# Patient Record
Sex: Female | Born: 1950 | Race: White | Hispanic: No | Smoking: Former smoker
Health system: Southern US, Community
[De-identification: ages and names within clinical notes are randomized; demographics above are authoritative.]

## PROBLEM LIST (undated history)

## (undated) DIAGNOSIS — E785 Hyperlipidemia, unspecified: Secondary | ICD-10-CM

## (undated) DIAGNOSIS — I10 Essential (primary) hypertension: Secondary | ICD-10-CM

## (undated) HISTORY — PX: CHOLECYSTECTOMY: SHX55

---

## 2014-07-15 ENCOUNTER — Emergency Department: Payer: Self-pay | Admitting: Student

## 2014-08-23 DIAGNOSIS — I10 Essential (primary) hypertension: Secondary | ICD-10-CM | POA: Insufficient documentation

## 2014-09-14 ENCOUNTER — Other Ambulatory Visit: Payer: Self-pay

## 2014-09-14 ENCOUNTER — Encounter: Payer: Self-pay | Admitting: *Deleted

## 2014-09-14 ENCOUNTER — Emergency Department
Admission: EM | Admit: 2014-09-14 | Discharge: 2014-09-14 | Disposition: A | Discharge status: Home / Self Care | Payer: No Typology Code available for payment source | Attending: Emergency Medicine | Admitting: Emergency Medicine

## 2014-09-14 DIAGNOSIS — Z87891 Personal history of nicotine dependence: Secondary | ICD-10-CM | POA: Diagnosis not present

## 2014-09-14 DIAGNOSIS — I10 Essential (primary) hypertension: Secondary | ICD-10-CM | POA: Diagnosis not present

## 2014-09-14 DIAGNOSIS — R799 Abnormal finding of blood chemistry, unspecified: Secondary | ICD-10-CM | POA: Diagnosis present

## 2014-09-14 HISTORY — DX: Essential (primary) hypertension: I10

## 2014-09-14 LAB — CBC
HEMATOCRIT: 49.1 % — AB (ref 35.0–47.0)
Hemoglobin: 16.3 g/dL — ABNORMAL HIGH (ref 12.0–16.0)
MCH: 32.6 pg (ref 26.0–34.0)
MCHC: 33.2 g/dL (ref 32.0–36.0)
MCV: 98.1 fL (ref 80.0–100.0)
Platelets: 246 10*3/uL (ref 150–440)
RBC: 5.01 MIL/uL (ref 3.80–5.20)
RDW: 13.2 % (ref 11.5–14.5)
WBC: 7.4 10*3/uL (ref 3.6–11.0)

## 2014-09-14 LAB — BASIC METABOLIC PANEL
Anion gap: 9 (ref 5–15)
BUN: 16 mg/dL (ref 6–20)
CALCIUM: 10.5 mg/dL — AB (ref 8.9–10.3)
CHLORIDE: 97 mmol/L — AB (ref 101–111)
CO2: 29 mmol/L (ref 22–32)
CREATININE: 1.01 mg/dL — AB (ref 0.44–1.00)
GFR calc Af Amer: >60 mL/min (ref >60)
GFR calc non Af Amer: 58 mL/min — ABNORMAL LOW (ref >60)
Glucose, Bld: 80 mg/dL (ref 65–99)
POTASSIUM: 4.9 mmol/L (ref 3.5–5.1)
SODIUM: 135 mmol/L (ref 135–145)

## 2014-09-14 LAB — TROPONIN I: Troponin I: <0.03 ng/mL (ref <0.031)

## 2014-09-14 NOTE — ED Notes (Signed)
Pt saw her doctor Thayer Ohm at [facility]) today for evaluation of HBP. They called tonight to tell her to get further evaluation for high potassium (6.3). Pt denies further symptoms at this time.

## 2014-09-14 NOTE — ED Notes (Signed)
Patient with no complaints at this time. Respirations even and unlabored. Skin warm/dry. Discharge instructions reviewed with patient at this time. Patient given opportunity to voice concerns/ask questions. Patient discharged at this time and left Emergency Department with steady gait.   

## 2014-09-14 NOTE — ED Provider Notes (Signed)
[facility] Emergency Department Provider Note  ____________________________________________  Time seen: Approximately 10:02 PM  I have reviewed the triage vital signs and the nursing notes.   HISTORY  Chief Complaint Abnormal Lab    HPI Tammy Shelton is a 64 y.o. female who presents at the request of her primary care doctor for a high potassium of 6.3.  Her primary care doctor recently started her on HCTZ for her high blood pressure.  During her last blood work couple of weeks ago her potassium was normal at 5.0.  Her primary care doctor called her today to tell her that her most recent potassium is 6.3 and that she should go to the emergency department.  She denies chest pain, shortness of breath, nausea, vomiting, diarrhea.  She has had normal eating and drinking habits lately and has no history of kidney disease.   Past Medical History  Diagnosis Date  . Hypertension     There are no active problems to display for this patient.   Past Surgical History  Procedure Laterality Date  . Cholecystectomy      No current outpatient prescriptions on file.  Allergies Review of patient's allergies indicates no known allergies.  No family history on file.  Social History History  Substance Use Topics  . Smoking status: Former Research scientist (life sciences)  . Smokeless tobacco: Not on file  . Alcohol Use: Yes    Review of Systems Constitutional: No fever/chills Eyes: No visual changes. ENT: No sore throat. Cardiovascular: Denies chest pain. Respiratory: Denies shortness of breath. Gastrointestinal: No abdominal pain.  No nausea, no vomiting.  No diarrhea.  No constipation. Genitourinary: Negative for dysuria. Musculoskeletal: Negative for back pain. Skin: Negative for rash. Neurological: Negative for headaches, focal weakness or numbness.  10-point ROS otherwise negative.  ____________________________________________   PHYSICAL EXAM:  VITAL SIGNS: ED  Triage Vitals  Enc Vitals Group     BP 09/14/14 2052 155/101 mmHg     Pulse Rate 09/14/14 2052 83     Resp 09/14/14 2052 16     Temp 09/14/14 2052 98.3 F (36.8 C)     Temp Source 09/14/14 2052 Oral     SpO2 09/14/14 2052 100 %     Weight 09/14/14 2052 156 lb (70.761 kg)     Height 09/14/14 2052 5' (1.524 m)     Head Cir --      Peak Flow --      Pain Score --      Pain Loc --      Pain Edu? --      Excl. in [place]? --     Constitutional: Alert and oriented. Well appearing and in no acute distress. Eyes: Conjunctivae are normal. PERRL. EOMI. Head: Atraumatic. Nose: No congestion/rhinnorhea. Mouth/Throat: Mucous membranes are moist.  Oropharynx non-erythematous. Neck: No stridor.   Cardiovascular: Normal rate, regular rhythm. Grossly normal heart sounds.  Good peripheral circulation. Respiratory: Normal respiratory effort.  No retractions. Lungs CTAB. Gastrointestinal: Soft and nontender. No distention. No abdominal bruits. No CVA tenderness. Musculoskeletal: No lower extremity tenderness nor edema.  No joint effusions. Neurologic:  Normal speech and language. No gross focal neurologic deficits are appreciated. Speech is normal. No gait instability. Skin:  Skin is warm, dry and intact. No rash noted. Psychiatric: Mood and affect are normal. Speech and behavior are normal.  ____________________________________________   LABS  Results for orders placed or performed during the hospital encounter of 09/14/14  CBC  Result Value Ref Range  WBC 7.4 3.6 - 11.0 K/uL   RBC 5.01 3.80 - 5.20 MIL/uL   Hemoglobin 16.3 (H) 12.0 - 16.0 g/dL   HCT 49.1 (H) 35.0 - 47.0 %   MCV 98.1 80.0 - 100.0 fL   MCH 32.6 26.0 - 34.0 pg   MCHC 33.2 32.0 - 36.0 g/dL   RDW 13.2 11.5 - 14.5 %   Platelets 246 150 - 440 K/uL  Basic metabolic panel  Result Value Ref Range   Sodium 135 135 - 145 mmol/L   Potassium 4.9 3.5 - 5.1 mmol/L   Chloride 97 (L) 101 - 111 mmol/L   CO2 29 22 - 32 mmol/L   Glucose,  Bld 80 65 - 99 mg/dL   BUN 16 6 - 20 mg/dL   Creatinine, Ser 1.01 (H) 0.44 - 1.00 mg/dL   Calcium 10.5 (H) 8.9 - 10.3 mg/dL   GFR calc non Af Amer 58 (L) >60 mL/min   GFR calc Af Amer >60 >60 mL/min   Anion gap 9 5 - 15  Troponin I  Result Value Ref Range   Troponin I <0.03 <0.031 ng/mL     ____________________________________________  EKG   Date: 09/14/2014  Rate: 64  Rhythm: normal sinus rhythm  QRS Axis: normal  Intervals: normal  ST/T Wave abnormalities: normal.  T waves are normal amplitude (not tall)  Conduction Disutrbances: none  Narrative Interpretation: unremarkable     ____________________________________________  RADIOLOGY  No results found.  ____________________________________________   PROCEDURES  Procedure(s) performed: None  Critical Care performed: No  ____________________________________________   INITIAL IMPRESSION / ASSESSMENT AND PLAN / ED COURSE  Pertinent labs & imaging results that were available during my care of the patient were reviewed by me and considered in my medical decision making (see chart for details).  Repeat blood work here in the emergency department was normal.  Based on her history, lack of symptoms, reassuring EKG, normal labs, I am confident that the outpatient lab value was erroneously high.  I discussed with this with the patient and she understands and agrees with the plan for outpatient follow-up and repeat lab work as needed by her primary care physician.  I gave her my usual and customary return precautions. ____________________________________________   FINAL CLINICAL IMPRESSION(S) / ED DIAGNOSES  Final diagnoses:  Chronic hypertension     Hinda Kehr, MD 09/14/14 2231

## 2014-09-14 NOTE — ED Notes (Signed)
Pt was seen by PCP with blood work completed at that time. PCP called and advised pt to seek further medical treatment today for follow-up with increased K+ level of 6.3. Pt is calm, NAD noted and denies any pain at this time. Pt is alert and oriented x4.

## 2014-09-14 NOTE — Discharge Instructions (Signed)
Fortunately your repeat lab work here in the emergency department was normal and you have a normal EKG.  It is very likely that the outpatient lab value for potassium was erroneously high.  This happens sometimes in all labs and is nothing to be concerned about at this time.  We do recommend that he follow up with your primary care doctor for repeat testing as she feels as appropriate.  If he develop any new or concerning symptoms, please return to the emergency department.   Hypertension Hypertension, commonly called high blood pressure, is when the force of blood pumping through your arteries is too strong. Your arteries are the blood vessels that carry blood from your heart throughout your body. A blood pressure reading consists of a higher number over a lower number, such as 110/72. The higher number (systolic) is the pressure inside your arteries when your heart pumps. The lower number (diastolic) is the pressure inside your arteries when your heart relaxes. Ideally you want your blood pressure below 120/80. Hypertension forces your heart to work harder to pump blood. Your arteries may become narrow or stiff. Having hypertension puts you at risk for heart disease, stroke, and other problems.  RISK FACTORS Some risk factors for high blood pressure are controllable. Others are not.  Risk factors you cannot control include:   Race. You may be at higher risk if you are African American.  Age. Risk increases with age.  Gender. Men are at higher risk than women before age 38 years. After age 38, women are at higher risk than men. Risk factors you can control include:  Not getting enough exercise or physical activity.  Being overweight.  Getting too much fat, sugar, calories, or salt in your diet.  Drinking too much alcohol. SIGNS AND SYMPTOMS Hypertension does not usually cause signs or symptoms. Extremely high blood pressure (hypertensive crisis) may cause headache, anxiety, shortness of  breath, and nosebleed. DIAGNOSIS  To check if you have hypertension, your health care provider will measure your blood pressure while you are seated, with your arm held at the level of your heart. It should be measured at least twice using the same arm. Certain conditions can cause a difference in blood pressure between your right and left arms. A blood pressure reading that is higher than normal on one occasion does not mean that you need treatment. If one blood pressure reading is high, ask your health care provider about having it checked again. TREATMENT  Treating high blood pressure includes making lifestyle changes and possibly taking medicine. Living a healthy lifestyle can help lower high blood pressure. You may need to change some of your habits. Lifestyle changes may include:  Following the DASH diet. This diet is high in fruits, vegetables, and whole grains. It is low in salt, red meat, and added sugars.  Getting at least 2 hours of brisk physical activity every week.  Losing weight if necessary.  Not smoking.  Limiting alcoholic beverages.  Learning ways to reduce stress. If lifestyle changes are not enough to get your blood pressure under control, your health care provider may prescribe medicine. You may need to take more than one. Work closely with your health care provider to understand the risks and benefits. HOME CARE INSTRUCTIONS  Have your blood pressure rechecked as directed by your health care provider.   Take medicines only as directed by your health care provider. Follow the directions carefully. Blood pressure medicines must be taken as prescribed. The medicine does  not work as well when you skip doses. Skipping doses also puts you at risk for problems.   Do not smoke.   Monitor your blood pressure at home as directed by your health care provider. SEEK MEDICAL CARE IF:   You think you are having a reaction to medicines taken.  You have recurrent headaches  or feel dizzy.  You have swelling in your ankles.  You have trouble with your vision. SEEK IMMEDIATE MEDICAL CARE IF:  You develop a severe headache or confusion.  You have unusual weakness, numbness, or feel faint.  You have severe chest or abdominal pain.  You vomit repeatedly.  You have trouble breathing. MAKE SURE YOU:   Understand these instructions.  Will watch your condition.  Will get help right away if you are not doing well or get worse. Document Released: 04/21/2005 Document Revised: 09/05/2013 Document Reviewed: 02/11/2013 [facility] Patient Information 2015 [place], [place]. This information is not intended to replace advice given to you by your health care provider. Make sure you discuss any questions you have with your health care provider.

## 2015-05-29 DIAGNOSIS — Z87891 Personal history of nicotine dependence: Secondary | ICD-10-CM | POA: Insufficient documentation

## 2015-06-19 DIAGNOSIS — M2550 Pain in unspecified joint: Secondary | ICD-10-CM | POA: Insufficient documentation

## 2015-06-19 DIAGNOSIS — R768 Other specified abnormal immunological findings in serum: Secondary | ICD-10-CM | POA: Insufficient documentation

## 2015-06-19 DIAGNOSIS — N183 Chronic kidney disease, stage 3 unspecified: Secondary | ICD-10-CM | POA: Insufficient documentation

## 2015-11-29 DIAGNOSIS — E7800 Pure hypercholesterolemia, unspecified: Secondary | ICD-10-CM | POA: Insufficient documentation

## 2015-12-11 DIAGNOSIS — E875 Hyperkalemia: Secondary | ICD-10-CM | POA: Diagnosis not present

## 2015-12-11 DIAGNOSIS — E559 Vitamin D deficiency, unspecified: Secondary | ICD-10-CM | POA: Diagnosis not present

## 2015-12-11 DIAGNOSIS — N182 Chronic kidney disease, stage 2 (mild): Secondary | ICD-10-CM | POA: Diagnosis not present

## 2015-12-11 DIAGNOSIS — I10 Essential (primary) hypertension: Secondary | ICD-10-CM | POA: Diagnosis not present

## 2016-05-07 DIAGNOSIS — H6122 Impacted cerumen, left ear: Secondary | ICD-10-CM | POA: Diagnosis not present

## 2016-06-03 DIAGNOSIS — N183 Chronic kidney disease, stage 3 (moderate): Secondary | ICD-10-CM | POA: Diagnosis not present

## 2016-06-03 DIAGNOSIS — E7800 Pure hypercholesterolemia, unspecified: Secondary | ICD-10-CM | POA: Diagnosis not present

## 2016-06-03 DIAGNOSIS — I10 Essential (primary) hypertension: Secondary | ICD-10-CM | POA: Diagnosis not present

## 2017-01-07 ENCOUNTER — Other Ambulatory Visit: Payer: Self-pay | Admitting: Family Medicine

## 2017-01-07 DIAGNOSIS — E559 Vitamin D deficiency, unspecified: Secondary | ICD-10-CM | POA: Diagnosis not present

## 2017-01-07 DIAGNOSIS — Z1231 Encounter for screening mammogram for malignant neoplasm of breast: Secondary | ICD-10-CM | POA: Diagnosis not present

## 2017-01-07 DIAGNOSIS — E871 Hypo-osmolality and hyponatremia: Secondary | ICD-10-CM | POA: Diagnosis not present

## 2017-01-07 DIAGNOSIS — N183 Chronic kidney disease, stage 3 (moderate): Secondary | ICD-10-CM | POA: Diagnosis not present

## 2017-01-07 DIAGNOSIS — Z79899 Other long term (current) drug therapy: Secondary | ICD-10-CM | POA: Diagnosis not present

## 2017-01-07 DIAGNOSIS — E785 Hyperlipidemia, unspecified: Secondary | ICD-10-CM | POA: Diagnosis not present

## 2017-01-07 DIAGNOSIS — I10 Essential (primary) hypertension: Secondary | ICD-10-CM | POA: Diagnosis not present

## 2017-01-07 DIAGNOSIS — Z1159 Encounter for screening for other viral diseases: Secondary | ICD-10-CM | POA: Diagnosis not present

## 2017-01-12 DIAGNOSIS — E871 Hypo-osmolality and hyponatremia: Secondary | ICD-10-CM | POA: Insufficient documentation

## 2017-01-23 ENCOUNTER — Ambulatory Visit
Admission: RE | Admit: 2017-01-23 | Discharge: 2017-01-23 | Disposition: A | Discharge status: Home / Self Care | Payer: Medicare HMO | Source: Ambulatory Visit | Attending: Family Medicine | Admitting: Family Medicine

## 2017-01-23 DIAGNOSIS — Z1231 Encounter for screening mammogram for malignant neoplasm of breast: Secondary | ICD-10-CM | POA: Diagnosis not present

## 2017-02-13 DIAGNOSIS — Z8639 Personal history of other endocrine, nutritional and metabolic disease: Secondary | ICD-10-CM | POA: Insufficient documentation

## 2017-02-13 DIAGNOSIS — I10 Essential (primary) hypertension: Secondary | ICD-10-CM | POA: Diagnosis not present

## 2017-02-13 DIAGNOSIS — N183 Chronic kidney disease, stage 3 (moderate): Secondary | ICD-10-CM | POA: Diagnosis not present

## 2017-10-23 DIAGNOSIS — Z1231 Encounter for screening mammogram for malignant neoplasm of breast: Secondary | ICD-10-CM | POA: Diagnosis not present

## 2017-10-23 DIAGNOSIS — E7800 Pure hypercholesterolemia, unspecified: Secondary | ICD-10-CM | POA: Diagnosis not present

## 2017-10-23 DIAGNOSIS — R918 Other nonspecific abnormal finding of lung field: Secondary | ICD-10-CM | POA: Diagnosis not present

## 2017-10-23 DIAGNOSIS — I10 Essential (primary) hypertension: Secondary | ICD-10-CM | POA: Diagnosis not present

## 2017-10-23 DIAGNOSIS — Z0000 Encounter for general adult medical examination without abnormal findings: Secondary | ICD-10-CM | POA: Diagnosis not present

## 2017-10-23 DIAGNOSIS — Z8639 Personal history of other endocrine, nutritional and metabolic disease: Secondary | ICD-10-CM | POA: Diagnosis not present

## 2017-10-23 DIAGNOSIS — N183 Chronic kidney disease, stage 3 (moderate): Secondary | ICD-10-CM | POA: Diagnosis not present

## 2017-10-23 DIAGNOSIS — Z79899 Other long term (current) drug therapy: Secondary | ICD-10-CM | POA: Diagnosis not present

## 2017-10-26 ENCOUNTER — Other Ambulatory Visit: Payer: Self-pay | Admitting: Internal Medicine

## 2017-10-26 DIAGNOSIS — R911 Solitary pulmonary nodule: Secondary | ICD-10-CM

## 2017-10-30 ENCOUNTER — Ambulatory Visit
Admission: RE | Admit: 2017-10-30 | Discharge: 2017-10-30 | Disposition: A | Discharge status: Home / Self Care | Payer: Medicare HMO | Source: Ambulatory Visit | Attending: Internal Medicine | Admitting: Internal Medicine

## 2017-10-30 DIAGNOSIS — J439 Emphysema, unspecified: Secondary | ICD-10-CM | POA: Diagnosis not present

## 2017-10-30 DIAGNOSIS — R911 Solitary pulmonary nodule: Secondary | ICD-10-CM | POA: Diagnosis not present

## 2017-10-30 DIAGNOSIS — I700 Atherosclerosis of aorta: Secondary | ICD-10-CM | POA: Insufficient documentation

## 2017-10-30 DIAGNOSIS — R918 Other nonspecific abnormal finding of lung field: Secondary | ICD-10-CM | POA: Diagnosis not present

## 2017-10-30 DIAGNOSIS — I2510 Atherosclerotic heart disease of native coronary artery without angina pectoris: Secondary | ICD-10-CM | POA: Insufficient documentation

## 2017-10-30 MED ORDER — IOPAMIDOL (ISOVUE-300) INJECTION 61%
65.0000 mL | Freq: Once | INTRAVENOUS | Status: AC | PRN
  Administered 2017-10-30: 65 mL via INTRAVENOUS

## 2017-11-13 ENCOUNTER — Other Ambulatory Visit: Payer: Self-pay | Admitting: Internal Medicine

## 2017-11-13 DIAGNOSIS — R942 Abnormal results of pulmonary function studies: Secondary | ICD-10-CM

## 2018-01-29 DIAGNOSIS — H1850 Unspecified hereditary corneal dystrophies: Secondary | ICD-10-CM | POA: Diagnosis not present

## 2018-02-19 ENCOUNTER — Ambulatory Visit: Payer: Medicare HMO

## 2018-04-23 DIAGNOSIS — Z8639 Personal history of other endocrine, nutritional and metabolic disease: Secondary | ICD-10-CM | POA: Diagnosis not present

## 2018-04-23 DIAGNOSIS — R918 Other nonspecific abnormal finding of lung field: Secondary | ICD-10-CM | POA: Insufficient documentation

## 2018-04-23 DIAGNOSIS — Z79899 Other long term (current) drug therapy: Secondary | ICD-10-CM | POA: Diagnosis not present

## 2018-04-23 DIAGNOSIS — I10 Essential (primary) hypertension: Secondary | ICD-10-CM | POA: Diagnosis not present

## 2018-04-23 DIAGNOSIS — E7800 Pure hypercholesterolemia, unspecified: Secondary | ICD-10-CM | POA: Diagnosis not present

## 2018-04-23 DIAGNOSIS — E871 Hypo-osmolality and hyponatremia: Secondary | ICD-10-CM | POA: Diagnosis not present

## 2018-04-23 DIAGNOSIS — N183 Chronic kidney disease, stage 3 (moderate): Secondary | ICD-10-CM | POA: Diagnosis not present

## 2018-10-20 DIAGNOSIS — E785 Hyperlipidemia, unspecified: Secondary | ICD-10-CM | POA: Diagnosis not present

## 2018-10-20 DIAGNOSIS — I10 Essential (primary) hypertension: Secondary | ICD-10-CM | POA: Diagnosis not present

## 2018-10-20 DIAGNOSIS — Z87891 Personal history of nicotine dependence: Secondary | ICD-10-CM | POA: Diagnosis not present

## 2018-10-25 ENCOUNTER — Other Ambulatory Visit: Payer: Self-pay | Admitting: Internal Medicine

## 2018-10-25 DIAGNOSIS — Z1231 Encounter for screening mammogram for malignant neoplasm of breast: Secondary | ICD-10-CM

## 2018-10-29 ENCOUNTER — Other Ambulatory Visit: Payer: Self-pay

## 2018-10-29 ENCOUNTER — Ambulatory Visit
Admission: RE | Admit: 2018-10-29 | Discharge: 2018-10-29 | Disposition: A | Discharge status: Home / Self Care | Payer: Medicare HMO | Source: Ambulatory Visit | Attending: Internal Medicine | Admitting: Internal Medicine

## 2018-10-29 DIAGNOSIS — Z1231 Encounter for screening mammogram for malignant neoplasm of breast: Secondary | ICD-10-CM | POA: Diagnosis not present

## 2018-11-12 DIAGNOSIS — Z0000 Encounter for general adult medical examination without abnormal findings: Secondary | ICD-10-CM | POA: Diagnosis not present

## 2018-11-12 DIAGNOSIS — E7800 Pure hypercholesterolemia, unspecified: Secondary | ICD-10-CM | POA: Diagnosis not present

## 2018-11-12 DIAGNOSIS — Z79899 Other long term (current) drug therapy: Secondary | ICD-10-CM | POA: Diagnosis not present

## 2018-11-12 DIAGNOSIS — N183 Chronic kidney disease, stage 3 (moderate): Secondary | ICD-10-CM | POA: Diagnosis not present

## 2018-11-12 DIAGNOSIS — J449 Chronic obstructive pulmonary disease, unspecified: Secondary | ICD-10-CM | POA: Diagnosis not present

## 2018-11-12 DIAGNOSIS — J431 Panlobular emphysema: Secondary | ICD-10-CM | POA: Diagnosis not present

## 2018-11-12 DIAGNOSIS — Z1211 Encounter for screening for malignant neoplasm of colon: Secondary | ICD-10-CM | POA: Diagnosis not present

## 2018-11-12 DIAGNOSIS — Z8639 Personal history of other endocrine, nutritional and metabolic disease: Secondary | ICD-10-CM | POA: Diagnosis not present

## 2018-11-12 DIAGNOSIS — I10 Essential (primary) hypertension: Secondary | ICD-10-CM | POA: Diagnosis not present

## 2018-11-12 DIAGNOSIS — R918 Other nonspecific abnormal finding of lung field: Secondary | ICD-10-CM | POA: Diagnosis not present

## 2018-11-12 DIAGNOSIS — R399 Unspecified symptoms and signs involving the genitourinary system: Secondary | ICD-10-CM | POA: Diagnosis not present

## 2018-11-19 DIAGNOSIS — R918 Other nonspecific abnormal finding of lung field: Secondary | ICD-10-CM | POA: Diagnosis not present

## 2018-11-22 DIAGNOSIS — Z1211 Encounter for screening for malignant neoplasm of colon: Secondary | ICD-10-CM | POA: Diagnosis not present

## 2019-05-20 DIAGNOSIS — R918 Other nonspecific abnormal finding of lung field: Secondary | ICD-10-CM | POA: Diagnosis not present

## 2019-05-20 DIAGNOSIS — I129 Hypertensive chronic kidney disease with stage 1 through stage 4 chronic kidney disease, or unspecified chronic kidney disease: Secondary | ICD-10-CM | POA: Diagnosis not present

## 2019-05-20 DIAGNOSIS — E785 Hyperlipidemia, unspecified: Secondary | ICD-10-CM | POA: Diagnosis not present

## 2019-05-20 DIAGNOSIS — Z0000 Encounter for general adult medical examination without abnormal findings: Secondary | ICD-10-CM | POA: Diagnosis not present

## 2019-05-20 DIAGNOSIS — Z87891 Personal history of nicotine dependence: Secondary | ICD-10-CM | POA: Diagnosis not present

## 2019-05-20 DIAGNOSIS — N189 Chronic kidney disease, unspecified: Secondary | ICD-10-CM | POA: Diagnosis not present

## 2019-06-03 DIAGNOSIS — Z87891 Personal history of nicotine dependence: Secondary | ICD-10-CM | POA: Diagnosis not present

## 2019-06-03 DIAGNOSIS — E7800 Pure hypercholesterolemia, unspecified: Secondary | ICD-10-CM | POA: Diagnosis not present

## 2019-06-03 DIAGNOSIS — R918 Other nonspecific abnormal finding of lung field: Secondary | ICD-10-CM | POA: Diagnosis not present

## 2019-06-03 DIAGNOSIS — I10 Essential (primary) hypertension: Secondary | ICD-10-CM | POA: Diagnosis not present

## 2019-06-03 DIAGNOSIS — Z79899 Other long term (current) drug therapy: Secondary | ICD-10-CM | POA: Diagnosis not present

## 2019-06-03 DIAGNOSIS — J90 Pleural effusion, not elsewhere classified: Secondary | ICD-10-CM | POA: Diagnosis not present

## 2019-06-06 ENCOUNTER — Other Ambulatory Visit: Payer: Self-pay | Admitting: Internal Medicine

## 2019-06-06 DIAGNOSIS — J90 Pleural effusion, not elsewhere classified: Secondary | ICD-10-CM

## 2019-06-09 ENCOUNTER — Ambulatory Visit: Payer: Medicare HMO

## 2019-06-10 ENCOUNTER — Other Ambulatory Visit: Payer: Self-pay

## 2019-06-10 ENCOUNTER — Ambulatory Visit
Admission: RE | Admit: 2019-06-10 | Discharge: 2019-06-10 | Disposition: A | Discharge status: Home / Self Care | Payer: Medicare HMO | Source: Ambulatory Visit | Attending: Internal Medicine | Admitting: Internal Medicine

## 2019-06-10 ENCOUNTER — Encounter (INDEPENDENT_AMBULATORY_CARE_PROVIDER_SITE_OTHER): Payer: Self-pay

## 2019-06-10 DIAGNOSIS — J90 Pleural effusion, not elsewhere classified: Secondary | ICD-10-CM | POA: Diagnosis not present

## 2019-06-10 DIAGNOSIS — R918 Other nonspecific abnormal finding of lung field: Secondary | ICD-10-CM | POA: Diagnosis not present

## 2019-06-10 MED ORDER — IOHEXOL 300 MG/ML  SOLN
75.0000 mL | Freq: Once | INTRAMUSCULAR | Status: AC | PRN
  Administered 2019-06-10: 75 mL via INTRAVENOUS

## 2019-06-14 ENCOUNTER — Other Ambulatory Visit: Payer: Self-pay

## 2019-06-14 ENCOUNTER — Other Ambulatory Visit: Payer: Self-pay | Admitting: Pulmonary Disease

## 2019-06-14 ENCOUNTER — Other Ambulatory Visit
Admission: RE | Admit: 2019-06-14 | Discharge: 2019-06-14 | Disposition: A | Discharge status: Home / Self Care | Payer: Medicare HMO | Source: Ambulatory Visit | Attending: Pulmonary Disease | Admitting: Pulmonary Disease

## 2019-06-14 DIAGNOSIS — Z01812 Encounter for preprocedural laboratory examination: Secondary | ICD-10-CM | POA: Diagnosis not present

## 2019-06-14 DIAGNOSIS — Z20822 Contact with and (suspected) exposure to covid-19: Secondary | ICD-10-CM | POA: Diagnosis not present

## 2019-06-14 DIAGNOSIS — J90 Pleural effusion, not elsewhere classified: Secondary | ICD-10-CM

## 2019-06-14 LAB — SARS CORONAVIRUS 2 (TAT 6-24 HRS): SARS Coronavirus 2: NEGATIVE

## 2019-06-15 ENCOUNTER — Ambulatory Visit
Admission: RE | Admit: 2019-06-15 | Discharge: 2019-06-15 | Disposition: A | Discharge status: Home / Self Care | Payer: Medicare HMO | Source: Ambulatory Visit | Attending: Interventional Radiology | Admitting: Interventional Radiology

## 2019-06-15 ENCOUNTER — Ambulatory Visit
Admission: RE | Admit: 2019-06-15 | Discharge: 2019-06-15 | Disposition: A | Discharge status: Home / Self Care | Payer: Medicare HMO | Source: Ambulatory Visit | Attending: Pulmonary Disease | Admitting: Pulmonary Disease

## 2019-06-15 DIAGNOSIS — J90 Pleural effusion, not elsewhere classified: Secondary | ICD-10-CM | POA: Diagnosis not present

## 2019-06-15 DIAGNOSIS — Z9889 Other specified postprocedural states: Secondary | ICD-10-CM

## 2019-06-15 LAB — BODY FLUID CELL COUNT WITH DIFFERENTIAL
Eos, Fluid: 0 %
Lymphs, Fluid: 91 %
Monocyte-Macrophage-Serous Fluid: 3 %
Neutrophil Count, Fluid: 6 %
Total Nucleated Cell Count, Fluid: 859 cu mm

## 2019-06-15 LAB — LACTATE DEHYDROGENASE, PLEURAL OR PERITONEAL FLUID: LD, Fluid: 149 U/L — ABNORMAL HIGH (ref 3–23)

## 2019-06-15 LAB — PROTEIN, PLEURAL OR PERITONEAL FLUID: Total protein, fluid: 5 g/dL

## 2019-06-15 LAB — GLUCOSE, PLEURAL OR PERITONEAL FLUID: Glucose, Fluid: 89 mg/dL

## 2019-06-15 NOTE — Procedures (Signed)
Pre procedural Dx: Symptomatic pleural effusion Post procedural Dx: Same  Successful US guided right sided thoracentesis yielding 900 cc of serous pleural fluid.   Samples sent to lab for analysis.  EBL: None Complications: None immediate.  Ronny Bacon, MD Pager #: [phone]

## 2019-06-17 LAB — PH, BODY FLUID: pH, Body Fluid: 7.7

## 2019-06-17 LAB — ACID FAST SMEAR (AFB, MYCOBACTERIA): Acid Fast Smear: NEGATIVE

## 2019-06-17 LAB — CYTOLOGY - NON PAP

## 2019-06-19 LAB — BODY FLUID CULTURE
Culture: NO GROWTH
Gram Stain: NONE SEEN

## 2019-07-08 DIAGNOSIS — I10 Essential (primary) hypertension: Secondary | ICD-10-CM | POA: Diagnosis not present

## 2019-07-08 DIAGNOSIS — D649 Anemia, unspecified: Secondary | ICD-10-CM | POA: Diagnosis not present

## 2019-07-08 DIAGNOSIS — Z1211 Encounter for screening for malignant neoplasm of colon: Secondary | ICD-10-CM | POA: Diagnosis not present

## 2019-07-15 DIAGNOSIS — Z1211 Encounter for screening for malignant neoplasm of colon: Secondary | ICD-10-CM | POA: Diagnosis not present

## 2019-07-15 DIAGNOSIS — D649 Anemia, unspecified: Secondary | ICD-10-CM | POA: Diagnosis not present

## 2019-07-15 DIAGNOSIS — I10 Essential (primary) hypertension: Secondary | ICD-10-CM | POA: Diagnosis not present

## 2019-07-15 LAB — FUNGUS CULTURE WITH STAIN

## 2019-07-15 LAB — FUNGUS CULTURE RESULT

## 2019-07-15 LAB — FUNGAL ORGANISM REFLEX

## 2019-07-22 DIAGNOSIS — D649 Anemia, unspecified: Secondary | ICD-10-CM | POA: Diagnosis not present

## 2019-07-22 DIAGNOSIS — E538 Deficiency of other specified B group vitamins: Secondary | ICD-10-CM | POA: Diagnosis not present

## 2019-07-28 ENCOUNTER — Other Ambulatory Visit: Payer: Self-pay

## 2019-07-28 ENCOUNTER — Encounter: Payer: Self-pay | Admitting: Oncology

## 2019-07-28 NOTE — Progress Notes (Signed)
Pt called to go over information related to first visit at [facility]. Questions and concerns gone over.

## 2019-07-29 ENCOUNTER — Encounter (INDEPENDENT_AMBULATORY_CARE_PROVIDER_SITE_OTHER): Payer: Self-pay

## 2019-07-29 ENCOUNTER — Inpatient Hospital Stay: Payer: Medicare HMO

## 2019-07-29 ENCOUNTER — Inpatient Hospital Stay: Payer: Medicare HMO | Attending: Oncology | Admitting: Oncology

## 2019-07-29 VITALS — BP 136/57 | HR 66 | Temp 96.2°F | Resp 20 | Wt 146.3 lb

## 2019-07-29 DIAGNOSIS — Z87891 Personal history of nicotine dependence: Secondary | ICD-10-CM | POA: Diagnosis not present

## 2019-07-29 DIAGNOSIS — I10 Essential (primary) hypertension: Secondary | ICD-10-CM | POA: Diagnosis not present

## 2019-07-29 DIAGNOSIS — D649 Anemia, unspecified: Secondary | ICD-10-CM | POA: Diagnosis not present

## 2019-07-29 DIAGNOSIS — Z79899 Other long term (current) drug therapy: Secondary | ICD-10-CM | POA: Insufficient documentation

## 2019-07-29 DIAGNOSIS — R700 Elevated erythrocyte sedimentation rate: Secondary | ICD-10-CM | POA: Diagnosis not present

## 2019-07-29 DIAGNOSIS — E538 Deficiency of other specified B group vitamins: Secondary | ICD-10-CM | POA: Diagnosis not present

## 2019-07-29 LAB — SEDIMENTATION RATE: Sed Rate: 119 mm/hr — ABNORMAL HIGH (ref 0–30)

## 2019-07-29 LAB — CBC
HCT: 31.9 % — ABNORMAL LOW (ref 36.0–46.0)
Hemoglobin: 10.1 g/dL — ABNORMAL LOW (ref 12.0–15.0)
MCH: 28.9 pg (ref 26.0–34.0)
MCHC: 31.7 g/dL (ref 30.0–36.0)
MCV: 91.4 fL (ref 80.0–100.0)
Platelets: 374 10*3/uL (ref 150–400)
RBC: 3.49 MIL/uL — ABNORMAL LOW (ref 3.87–5.11)
RDW: 14.2 % (ref 11.5–15.5)
WBC: 6.1 10*3/uL (ref 4.0–10.5)
nRBC: 0 % (ref 0.0–0.2)

## 2019-07-29 LAB — DAT, POLYSPECIFIC AHG (ARMC ONLY): Polyspecific AHG test: NEGATIVE

## 2019-07-29 LAB — FOLATE: Folate: 10.4 ng/mL

## 2019-07-29 LAB — RETICULOCYTES
Immature Retic Fract: 11.9 % (ref 2.3–15.9)
RBC.: 3.51 MIL/uL — ABNORMAL LOW (ref 3.87–5.11)
Retic Count, Absolute: 55.8 K/uL (ref 19.0–186.0)
Retic Ct Pct: 1.6 % (ref 0.4–3.1)

## 2019-07-29 LAB — ACID FAST CULTURE WITH REFLEXED SENSITIVITIES (MYCOBACTERIA): Acid Fast Culture: NEGATIVE

## 2019-07-29 LAB — LACTATE DEHYDROGENASE: LDH: 116 U/L (ref 98–192)

## 2019-07-29 NOTE — Progress Notes (Signed)
[place]  Telephone:(336) [phone] Fax:(336) [phone]  ID: Caffie Pinto OB: Jan 29, 1951  MR#: [id number]  TDH#:[id number]  Patient Care Team: Idelle Crouch, MD as PCP - General (Internal Medicine)  CHIEF COMPLAINT: Anemia, unspecified.  INTERVAL HISTORY: Patient is a 69 year old female who was noted to have a declining hemoglobin on routine blood work.  She currently feels well and is asymptomatic.  She does not complain of any weakness or fatigue.  She has good appetite and denies weight loss.  She has no neurologic complaints.  She denies any recent fevers or illnesses.  She has no chest pain, shortness of breath, cough, or hemoptysis.  She denies any nausea, vomiting, constipation, or diarrhea.  She has no melena or hematochezia.  She has no urinary complaints.  Patient feels at her baseline offers no specific complaints today.  REVIEW OF SYSTEMS:   Review of Systems  Constitutional: Negative.  Negative for fever, malaise/fatigue and weight loss.  Respiratory: Negative.  Negative for cough, hemoptysis and shortness of breath.   Cardiovascular: Negative.  Negative for chest pain and leg swelling.  Gastrointestinal: Negative.  Negative for abdominal pain, blood in stool and melena.  Genitourinary: Negative.  Negative for hematuria.  Musculoskeletal: Negative.  Negative for back pain.  Skin: Negative.  Negative for rash.  Neurological: Negative.  Negative for dizziness, focal weakness, weakness and headaches.  Psychiatric/Behavioral: Negative.  The patient is not nervous/anxious.     As per HPI. Otherwise, a complete review of systems is negative.  PAST MEDICAL HISTORY: Past Medical History:  Diagnosis Date  . Hypertension     PAST SURGICAL HISTORY: Past Surgical History:  Procedure Laterality Date  . CHOLECYSTECTOMY      FAMILY HISTORY: Family History  Problem Relation Age of Onset  . Breast cancer Neg Hx     ADVANCED DIRECTIVES (Y/N):  N   HEALTH MAINTENANCE: Social History   Tobacco Use  . Smoking status: Former Smoker  Substance Use Topics  . Alcohol use: Yes  . Drug use: No     Colonoscopy:  PAP:  Bone density:  Lipid panel:  No Known Allergies  Current Outpatient Medications  Medication Sig Dispense Refill  . amLODipine (NORVASC) 10 MG tablet Take 1 tablet by mouth daily.    . hydrochlorothiazide (HYDRODIURIL) 12.5 MG tablet Take 1 tablet by mouth daily.    Marland Kitchen losartan (COZAAR) 50 MG tablet Take 1 tablet by mouth daily.    Marland Kitchen lovastatin (MEVACOR) 20 MG tablet Take 1 tablet by mouth daily with supper.    . umeclidinium-vilanterol (ANORO ELLIPTA) 62.5-25 MCG/INH AEPB Inhale 1 puff into the lungs daily.     No current facility-administered medications for this visit.    OBJECTIVE: Vitals:   07/29/19 0939  BP: (!) 136/57  Pulse: 66  Resp: 20  Temp: (!) 96.2 F (35.7 C)  SpO2: 99%     Body mass index is 28.57 kg/m.    ECOG FS:0 - Asymptomatic  General: Well-developed, well-nourished, no acute distress. Eyes: Pink conjunctiva, anicteric sclera. HEENT: Normocephalic, moist mucous membranes. Lungs: No audible wheezing or coughing. Heart: Regular rate and rhythm. Abdomen: Soft, nontender, no obvious distention. Musculoskeletal: No edema, cyanosis, or clubbing. Neuro: Alert, answering all questions appropriately. Cranial nerves grossly intact. Skin: No rashes or petechiae noted. Psych: Normal affect. Lymphatics: No cervical, calvicular, axillary or inguinal LAD.   LAB RESULTS:  Lab Results  Component Value Date   NA 135 09/14/2014   K 4.9 09/14/2014  CL 97 (L) 09/14/2014   CO2 29 09/14/2014   GLUCOSE 80 09/14/2014   BUN 16 09/14/2014   CREATININE 1.01 (H) 09/14/2014   CALCIUM 10.5 (H) 09/14/2014   GFRNONAA 58 (L) 09/14/2014   GFRAA >60 09/14/2014    Lab Results  Component Value Date   WBC 6.1 07/29/2019   HGB 10.1 (L) 07/29/2019   HCT 31.9 (L) 07/29/2019   MCV 91.4 07/29/2019   PLT  374 07/29/2019     STUDIES: No results found.  ASSESSMENT: Anemia, unspecified.  PLAN:   1.  Anemia unspecified: Patient's hemoglobin remains mildly decreased to 10.1.  Previously, iron stores within normal limits, but patient was noted to have a decreased B12 level.  Reticulocyte count is inappropriately normal for this level of anemia.  Folate levels are within normal limits and there is no evidence of hemolysis.  SPEP was ordered for completeness and is pending at time of dictation.  Patient was noted to have a significantly elevated sedimentation rate possibly may be contributing to some mild bone marrow suppression.  Bone marrow biopsy is not necessary at this time, but would consider one in the future if her hemoglobin continues to drop or she develops other cytopenias.  Return to clinic in 1 week for further evaluation, discussion of her laboratory work, and initiation of B12 injections. 2.  B12 deficiency: Initiate B12 as above. 3.  History of pleural effusion: Patient is status post thoracentesis on June 15, 2019 with no evidence of malignancy.  Continue follow-up and treatment per pulmonary.  Patient expressed understanding and was in agreement with this plan. She also understands that She can call clinic at any time with any questions, concerns, or complaints.    Lloyd Huger, MD   07/29/2019 12:41 PM

## 2019-07-30 LAB — HAPTOGLOBIN: Haptoglobin: 362 mg/dL — ABNORMAL HIGH (ref 37–355)

## 2019-07-30 LAB — ERYTHROPOIETIN: Erythropoietin: 11.2 m[IU]/mL (ref 2.6–18.5)

## 2019-07-30 NOTE — Progress Notes (Signed)
[place]  Telephone:(336) [phone] Fax:(336) [phone]  ID: Tammy Shelton OB: Oct 02, 1950  MR#: [id number]  WNI#:[id number]  Patient Care Team: Idelle Crouch, MD as PCP - General (Internal Medicine)  CHIEF COMPLAINT: Anemia, unspecified.  INTERVAL HISTORY: Patient returns to clinic today for further evaluation, discussion of her laboratory results, and initiation of B12 injections.  She continues to feel well and remains asymptomatic. She does not complain of any weakness or fatigue.  She has good appetite and denies weight loss.  She has no neurologic complaints.  She denies any recent fevers or illnesses.  She has no chest pain, shortness of breath, cough, or hemoptysis.  She denies any nausea, vomiting, constipation, or diarrhea.  She has no melena or hematochezia.  She has no urinary complaints.  Patient offers no specific complaints today.  REVIEW OF SYSTEMS:   Review of Systems  Constitutional: Negative.  Negative for fever, malaise/fatigue and weight loss.  Respiratory: Negative.  Negative for cough, hemoptysis and shortness of breath.   Cardiovascular: Negative.  Negative for chest pain and leg swelling.  Gastrointestinal: Negative.  Negative for abdominal pain, blood in stool and melena.  Genitourinary: Negative.  Negative for hematuria.  Musculoskeletal: Negative.  Negative for back pain.  Skin: Negative.  Negative for rash.  Neurological: Negative.  Negative for dizziness, focal weakness, weakness and headaches.  Psychiatric/Behavioral: Negative.  The patient is not nervous/anxious.     As per HPI. Otherwise, a complete review of systems is negative.  PAST MEDICAL HISTORY: Past Medical History:  Diagnosis Date  . Hypertension     PAST SURGICAL HISTORY: Past Surgical History:  Procedure Laterality Date  . CHOLECYSTECTOMY      FAMILY HISTORY: Family History  Problem Relation Age of Onset  . Breast cancer Neg Hx     ADVANCED DIRECTIVES  (Y/N):  N  HEALTH MAINTENANCE: Social History   Tobacco Use  . Smoking status: Former Research scientist (life sciences)  . Smokeless tobacco: Never Used  Substance Use Topics  . Alcohol use: Yes  . Drug use: No     Colonoscopy:  PAP:  Bone density:  Lipid panel:  No Known Allergies  Current Outpatient Medications  Medication Sig Dispense Refill  . amLODipine (NORVASC) 10 MG tablet Take 1 tablet by mouth daily.    . hydrochlorothiazide (HYDRODIURIL) 12.5 MG tablet Take 1 tablet by mouth daily.    Marland Kitchen losartan (COZAAR) 50 MG tablet Take 1 tablet by mouth daily.    Marland Kitchen lovastatin (MEVACOR) 20 MG tablet Take 1 tablet by mouth daily with supper.    . umeclidinium-vilanterol (ANORO ELLIPTA) 62.5-25 MCG/INH AEPB Inhale 1 puff into the lungs daily.     No current facility-administered medications for this visit.    OBJECTIVE: Vitals:   08/05/19 1026  BP: 117/61  Pulse: (!) 56  Resp: 18  Temp: (!) 96 F (35.6 C)  SpO2: 100%     Body mass index is 28.83 kg/m.    ECOG FS:0 - Asymptomatic  General: Well-developed, well-nourished, no acute distress. Eyes: Pink conjunctiva, anicteric sclera. HEENT: Normocephalic, moist mucous membranes. Lungs: No audible wheezing or coughing. Heart: Regular rate and rhythm. Abdomen: Soft, nontender, no obvious distention. Musculoskeletal: No edema, cyanosis, or clubbing. Neuro: Alert, answering all questions appropriately. Cranial nerves grossly intact. Skin: No rashes or petechiae noted. Psych: Normal affect.  LAB RESULTS:  Lab Results  Component Value Date   NA 135 09/14/2014   K 4.9 09/14/2014   CL 97 (L) 09/14/2014  CO2 29 09/14/2014   GLUCOSE 80 09/14/2014   BUN 16 09/14/2014   CREATININE 1.01 (H) 09/14/2014   CALCIUM 10.5 (H) 09/14/2014   GFRNONAA 58 (L) 09/14/2014   GFRAA >60 09/14/2014    Lab Results  Component Value Date   WBC 6.1 07/29/2019   HGB 10.1 (L) 07/29/2019   HCT 31.9 (L) 07/29/2019   MCV 91.4 07/29/2019   PLT 374 07/29/2019      STUDIES: No results found.  ASSESSMENT: Anemia, unspecified.  PLAN:   1.  Anemia unspecified: Patient's hemoglobin remains mildly decreased to 10.1.  Previously, iron stores within normal limits, but patient was noted to have a decreased B12 level.  Reticulocyte count is inappropriately normal for this level of anemia.  All of her other laboratory work is also either negative or within normal limits.  She was noted to have a significantly elevated sedimentation rate possibly may be contributing to some mild bone marrow suppression.  Bone marrow biopsy is not necessary at this time, but would consider one in the future if her hemoglobin continues to drop or she develops other cytopenias.  Proceed with B12 injection today.  Return to clinic monthly for B12 injections and then in 4 months for repeat laboratory work, further evaluation, and continuation of treatment if needed.   2.  B12 deficiency: Monthly B12 as above. 3.  History of pleural effusion: Patient is status post thoracentesis on June 15, 2019 with no evidence of malignancy.  Continue follow-up and treatment per pulmonary. 4.  Elevated sed rate: Unclear etiology, but trending down.  Repeat in 4 months.  Patient expressed understanding and was in agreement with this plan. She also understands that She can call clinic at any time with any questions, concerns, or complaints.    Lloyd Huger, MD   08/05/2019 1:36 PM

## 2019-08-02 LAB — PROTEIN ELECTROPHORESIS, SERUM
A/G Ratio: 0.9 (ref 0.7–1.7)
Albumin ELP: 3.7 g/dL (ref 2.9–4.4)
Alpha-1-Globulin: 0.4 g/dL (ref 0.0–0.4)
Alpha-2-Globulin: 1.2 g/dL — ABNORMAL HIGH (ref 0.4–1.0)
Beta Globulin: 1.4 g/dL — ABNORMAL HIGH (ref 0.7–1.3)
Gamma Globulin: 1.3 g/dL (ref 0.4–1.8)
Globulin, Total: 4.2 g/dL — ABNORMAL HIGH (ref 2.2–3.9)
Total Protein ELP: 7.9 g/dL (ref 6.0–8.5)

## 2019-08-04 ENCOUNTER — Encounter: Payer: Self-pay | Admitting: Oncology

## 2019-08-04 ENCOUNTER — Other Ambulatory Visit: Payer: Self-pay

## 2019-08-05 ENCOUNTER — Inpatient Hospital Stay: Payer: Medicare HMO | Attending: Oncology | Admitting: Oncology

## 2019-08-05 ENCOUNTER — Inpatient Hospital Stay: Payer: Medicare HMO

## 2019-08-05 ENCOUNTER — Encounter: Payer: Self-pay | Admitting: Oncology

## 2019-08-05 VITALS — BP 117/61 | HR 56 | Temp 96.0°F | Resp 18 | Wt 147.6 lb

## 2019-08-05 DIAGNOSIS — Z87891 Personal history of nicotine dependence: Secondary | ICD-10-CM | POA: Diagnosis not present

## 2019-08-05 DIAGNOSIS — I10 Essential (primary) hypertension: Secondary | ICD-10-CM | POA: Insufficient documentation

## 2019-08-05 DIAGNOSIS — E538 Deficiency of other specified B group vitamins: Secondary | ICD-10-CM | POA: Diagnosis not present

## 2019-08-05 DIAGNOSIS — D649 Anemia, unspecified: Secondary | ICD-10-CM

## 2019-08-05 DIAGNOSIS — Z79899 Other long term (current) drug therapy: Secondary | ICD-10-CM | POA: Insufficient documentation

## 2019-08-05 MED ORDER — CYANOCOBALAMIN 1000 MCG/ML IJ SOLN
1000.0000 ug | Freq: Once | INTRAMUSCULAR | Status: AC
  Administered 2019-08-05: 1000 ug via INTRAMUSCULAR

## 2019-09-05 ENCOUNTER — Other Ambulatory Visit: Payer: Self-pay

## 2019-09-05 ENCOUNTER — Inpatient Hospital Stay: Payer: Medicare HMO | Attending: Oncology

## 2019-09-05 DIAGNOSIS — D649 Anemia, unspecified: Secondary | ICD-10-CM

## 2019-09-05 DIAGNOSIS — E538 Deficiency of other specified B group vitamins: Secondary | ICD-10-CM | POA: Insufficient documentation

## 2019-09-05 MED ORDER — CYANOCOBALAMIN 1000 MCG/ML IJ SOLN
1000.0000 ug | Freq: Once | INTRAMUSCULAR | Status: AC
  Administered 2019-09-05: 11:00:00 1000 ug via INTRAMUSCULAR
  Filled 2019-09-05: qty 1

## 2019-10-10 ENCOUNTER — Inpatient Hospital Stay: Payer: Medicare HMO

## 2019-10-14 ENCOUNTER — Other Ambulatory Visit: Payer: Self-pay

## 2019-10-14 ENCOUNTER — Inpatient Hospital Stay: Payer: Medicare HMO | Attending: Oncology

## 2019-10-14 DIAGNOSIS — D649 Anemia, unspecified: Secondary | ICD-10-CM

## 2019-10-14 DIAGNOSIS — E538 Deficiency of other specified B group vitamins: Secondary | ICD-10-CM | POA: Diagnosis not present

## 2019-10-14 MED ORDER — CYANOCOBALAMIN 1000 MCG/ML IJ SOLN
1000.0000 ug | Freq: Once | INTRAMUSCULAR | Status: AC
  Administered 2019-10-14: 1000 ug via INTRAMUSCULAR
  Filled 2019-10-14: qty 1

## 2019-11-08 ENCOUNTER — Other Ambulatory Visit: Payer: Self-pay

## 2019-11-08 ENCOUNTER — Inpatient Hospital Stay: Payer: Medicare HMO | Attending: Oncology

## 2019-11-08 DIAGNOSIS — D649 Anemia, unspecified: Secondary | ICD-10-CM

## 2019-11-08 DIAGNOSIS — E538 Deficiency of other specified B group vitamins: Secondary | ICD-10-CM | POA: Insufficient documentation

## 2019-11-08 MED ORDER — CYANOCOBALAMIN 1000 MCG/ML IJ SOLN
1000.0000 ug | Freq: Once | INTRAMUSCULAR | Status: AC
  Administered 2019-11-08: 1000 ug via INTRAMUSCULAR
  Filled 2019-11-08: qty 1

## 2019-11-25 ENCOUNTER — Other Ambulatory Visit: Payer: Self-pay | Admitting: Internal Medicine

## 2019-11-25 DIAGNOSIS — J90 Pleural effusion, not elsewhere classified: Secondary | ICD-10-CM | POA: Diagnosis not present

## 2019-11-25 DIAGNOSIS — Z1231 Encounter for screening mammogram for malignant neoplasm of breast: Secondary | ICD-10-CM

## 2019-11-25 DIAGNOSIS — Z0000 Encounter for general adult medical examination without abnormal findings: Secondary | ICD-10-CM | POA: Diagnosis not present

## 2019-11-25 DIAGNOSIS — Z8639 Personal history of other endocrine, nutritional and metabolic disease: Secondary | ICD-10-CM | POA: Diagnosis not present

## 2019-11-25 DIAGNOSIS — R0789 Other chest pain: Secondary | ICD-10-CM | POA: Diagnosis not present

## 2019-11-25 DIAGNOSIS — Z79899 Other long term (current) drug therapy: Secondary | ICD-10-CM | POA: Diagnosis not present

## 2019-11-25 DIAGNOSIS — E7800 Pure hypercholesterolemia, unspecified: Secondary | ICD-10-CM | POA: Diagnosis not present

## 2019-11-25 DIAGNOSIS — Z87891 Personal history of nicotine dependence: Secondary | ICD-10-CM | POA: Diagnosis not present

## 2019-11-25 DIAGNOSIS — E538 Deficiency of other specified B group vitamins: Secondary | ICD-10-CM | POA: Diagnosis not present

## 2019-11-25 DIAGNOSIS — D509 Iron deficiency anemia, unspecified: Secondary | ICD-10-CM | POA: Diagnosis not present

## 2019-11-25 DIAGNOSIS — N1831 Chronic kidney disease, stage 3a: Secondary | ICD-10-CM | POA: Diagnosis not present

## 2019-11-25 DIAGNOSIS — I10 Essential (primary) hypertension: Secondary | ICD-10-CM | POA: Diagnosis not present

## 2019-12-05 ENCOUNTER — Other Ambulatory Visit: Payer: Self-pay

## 2019-12-05 ENCOUNTER — Inpatient Hospital Stay: Payer: Medicare HMO | Attending: Oncology

## 2019-12-05 DIAGNOSIS — I10 Essential (primary) hypertension: Secondary | ICD-10-CM | POA: Diagnosis not present

## 2019-12-05 DIAGNOSIS — Z79899 Other long term (current) drug therapy: Secondary | ICD-10-CM | POA: Insufficient documentation

## 2019-12-05 DIAGNOSIS — J90 Pleural effusion, not elsewhere classified: Secondary | ICD-10-CM | POA: Diagnosis not present

## 2019-12-05 DIAGNOSIS — E538 Deficiency of other specified B group vitamins: Secondary | ICD-10-CM | POA: Insufficient documentation

## 2019-12-05 DIAGNOSIS — D649 Anemia, unspecified: Secondary | ICD-10-CM | POA: Diagnosis not present

## 2019-12-05 DIAGNOSIS — R700 Elevated erythrocyte sedimentation rate: Secondary | ICD-10-CM | POA: Diagnosis not present

## 2019-12-05 LAB — CBC WITH DIFFERENTIAL/PLATELET
Abs Immature Granulocytes: 0.01 10*3/uL (ref 0.00–0.07)
Basophils Absolute: 0.1 10*3/uL (ref 0.0–0.1)
Basophils Relative: 1 %
Eosinophils Absolute: 0.1 10*3/uL (ref 0.0–0.5)
Eosinophils Relative: 1 %
HCT: 33.8 % — ABNORMAL LOW (ref 36.0–46.0)
Hemoglobin: 11.3 g/dL — ABNORMAL LOW (ref 12.0–15.0)
Immature Granulocytes: 0 %
Lymphocytes Relative: 32 %
Lymphs Abs: 2 10*3/uL (ref 0.7–4.0)
MCH: 31.8 pg (ref 26.0–34.0)
MCHC: 33.4 g/dL (ref 30.0–36.0)
MCV: 95.2 fL (ref 80.0–100.0)
Monocytes Absolute: 0.5 10*3/uL (ref 0.1–1.0)
Monocytes Relative: 7 %
Neutro Abs: 3.7 10*3/uL (ref 1.7–7.7)
Neutrophils Relative %: 59 %
Platelets: 286 10*3/uL (ref 150–400)
RBC: 3.55 MIL/uL — ABNORMAL LOW (ref 3.87–5.11)
RDW: 13.7 % (ref 11.5–15.5)
WBC: 6.3 10*3/uL (ref 4.0–10.5)
nRBC: 0 % (ref 0.0–0.2)

## 2019-12-05 LAB — SEDIMENTATION RATE: Sed Rate: 80 mm/hr — ABNORMAL HIGH (ref 0–30)

## 2019-12-09 NOTE — Progress Notes (Signed)
Tammy Shelton  [phone] [phone]) [phone]  ID: Tammy Shelton OB: 04-05-1951  MR#: [id number]  WUX#:[id number]  Patient Care Team: Idelle Crouch, MD as PCP - General (Internal Medicine)  CHIEF COMPLAINT: Anemia secondary to B12 deficiency.  INTERVAL HISTORY: Patient returns to clinic today for repeat laboratory work and continuation of B12 injections.  Since initiating injections several months ago, her energy levels have improved and she does not complain of any weakness or fatigue. She has good appetite and denies weight loss.  She has no neurologic complaints.  She denies any recent fevers or illnesses.  She has no chest pain, shortness of breath, cough, or hemoptysis.  She denies any nausea, vomiting, constipation, or diarrhea.  She has no melena or hematochezia.  She has no urinary complaints.  Patient offers no specific complaints today.  REVIEW OF SYSTEMS:   Review of Systems  Constitutional: Negative.  Negative for fever, malaise/fatigue and weight loss.  Respiratory: Negative.  Negative for cough, hemoptysis and shortness of breath.   Cardiovascular: Negative.  Negative for chest pain and leg swelling.  Gastrointestinal: Negative.  Negative for abdominal pain, blood in stool and melena.  Genitourinary: Negative.  Negative for hematuria.  Musculoskeletal: Negative.  Negative for back pain.  Skin: Negative.  Negative for rash.  Neurological: Negative.  Negative for dizziness, focal weakness, weakness and headaches.  Psychiatric/Behavioral: Negative.  The patient is not nervous/anxious.     As per HPI. Otherwise, a complete review of systems is negative.  PAST MEDICAL HISTORY: Past Medical History:  Diagnosis Date   Hypertension     PAST SURGICAL HISTORY: Past Surgical History:  Procedure Laterality Date   CHOLECYSTECTOMY      FAMILY HISTORY: Family History  Problem Relation Age of Onset   Breast cancer Neg Hx     ADVANCED  DIRECTIVES (Y/N):  N  HEALTH MAINTENANCE: Social History   Tobacco Use   Smoking status: Former Smoker   Smokeless tobacco: Never Used  Scientific laboratory technician Use: Never used  Substance Use Topics   Alcohol use: Yes   Drug use: No     Colonoscopy:  PAP:  Bone density:  Lipid panel:  No Known Allergies  Current Outpatient Medications  Medication Sig Dispense Refill   amLODipine (NORVASC) 10 MG tablet Take 1 tablet by mouth daily.     hydrochlorothiazide (HYDRODIURIL) 12.5 MG tablet Take 1 tablet by mouth daily.     losartan (COZAAR) 50 MG tablet Take 1 tablet by mouth daily.     lovastatin (MEVACOR) 20 MG tablet Take 1 tablet by mouth daily with supper.     umeclidinium-vilanterol (ANORO ELLIPTA) 62.5-25 MCG/INH AEPB Inhale 1 puff into the lungs daily.     No current facility-administered medications for this visit.    OBJECTIVE: Vitals:   12/12/19 1057  BP: 125/67  Pulse: (!) 54  Resp: 16  Temp: (!) 96.8 F (36 C)  SpO2: 99%     Body mass index is 28.01 kg/m.    ECOG FS:0 - Asymptomatic  General: Well-developed, well-nourished, no acute distress. Eyes: Pink conjunctiva, anicteric sclera. HEENT: Normocephalic, moist mucous membranes. Lungs: No audible wheezing or coughing. Heart: Regular rate and rhythm. Abdomen: Soft, nontender, no obvious distention. Musculoskeletal: No edema, cyanosis, or clubbing. Neuro: Alert, answering all questions appropriately. Cranial nerves grossly intact. Skin: No rashes or petechiae noted. Psych: Normal affect.  LAB RESULTS:  Lab Results  Component Value Date   NA 135 09/14/2014  K 4.9 09/14/2014   CL 97 (L) 09/14/2014   CO2 29 09/14/2014   GLUCOSE 80 09/14/2014   BUN 16 09/14/2014   CREATININE 1.01 (H) 09/14/2014   CALCIUM 10.5 (H) 09/14/2014   GFRNONAA 58 (L) 09/14/2014   GFRAA >60 09/14/2014    Lab Results  Component Value Date   WBC 6.3 12/05/2019   NEUTROABS 3.7 12/05/2019   HGB 11.3 (L) 12/05/2019    HCT 33.8 (L) 12/05/2019   MCV 95.2 12/05/2019   PLT 286 12/05/2019     STUDIES: No results found.  ASSESSMENT: Anemia secondary to B12 deficiency.  PLAN:   1.  Anemia secondary to B12 deficiency: Since initiating B12 injections, patient's hemoglobin has improved to 11.3.  All of her other laboratory work including iron stores is either negative or within normal limits.  Patient sed rate remains significantly elevated, but has significantly improved over the past several months.  No further intervention is needed.  Patient does not require bone marrow biopsy at this time.  Proceed with B12 injection today.  Return to clinic monthly for injections and then in 6 months with repeat laboratory work and further evaluation.   2.  B12 deficiency: Monthly B12 as above. 3.  History of pleural effusion: Patient is status post thoracentesis on June 15, 2019 with no evidence of malignancy.  Continue follow-up and treatment per pulmonary. 4.  Elevated sed rate: Unclear etiology, but trending down.   I spent a total of 30 minutes reviewing chart data, face-to-face evaluation with the patient, counseling and coordination of care as detailed above.   Patient expressed understanding and was in agreement with this plan. She also understands that She can call clinic at any time with any questions, concerns, or complaints.    Tammy Huger, MD   12/13/2019 6:31 AM

## 2019-12-12 ENCOUNTER — Other Ambulatory Visit: Payer: Self-pay

## 2019-12-12 ENCOUNTER — Inpatient Hospital Stay (HOSPITAL_BASED_OUTPATIENT_CLINIC_OR_DEPARTMENT_OTHER): Payer: Medicare HMO | Admitting: Oncology

## 2019-12-12 ENCOUNTER — Inpatient Hospital Stay: Payer: Medicare HMO

## 2019-12-12 ENCOUNTER — Encounter: Payer: Self-pay | Admitting: Oncology

## 2019-12-12 VITALS — BP 125/67 | HR 54 | Temp 96.8°F | Resp 16 | Wt 143.4 lb

## 2019-12-12 DIAGNOSIS — D649 Anemia, unspecified: Secondary | ICD-10-CM

## 2019-12-12 DIAGNOSIS — J90 Pleural effusion, not elsewhere classified: Secondary | ICD-10-CM | POA: Diagnosis not present

## 2019-12-12 DIAGNOSIS — E538 Deficiency of other specified B group vitamins: Secondary | ICD-10-CM | POA: Diagnosis not present

## 2019-12-12 DIAGNOSIS — Z79899 Other long term (current) drug therapy: Secondary | ICD-10-CM | POA: Diagnosis not present

## 2019-12-12 DIAGNOSIS — I10 Essential (primary) hypertension: Secondary | ICD-10-CM | POA: Diagnosis not present

## 2019-12-12 DIAGNOSIS — R700 Elevated erythrocyte sedimentation rate: Secondary | ICD-10-CM | POA: Diagnosis not present

## 2019-12-12 MED ORDER — CYANOCOBALAMIN 1000 MCG/ML IJ SOLN
1000.0000 ug | Freq: Once | INTRAMUSCULAR | Status: AC
  Administered 2019-12-12: 1000 ug via INTRAMUSCULAR
  Filled 2019-12-12: qty 1

## 2019-12-16 DIAGNOSIS — R918 Other nonspecific abnormal finding of lung field: Secondary | ICD-10-CM | POA: Diagnosis not present

## 2020-01-12 ENCOUNTER — Inpatient Hospital Stay: Payer: Medicare HMO

## 2020-01-13 ENCOUNTER — Inpatient Hospital Stay: Payer: Medicare HMO | Attending: Oncology

## 2020-01-13 ENCOUNTER — Other Ambulatory Visit: Payer: Self-pay

## 2020-01-13 DIAGNOSIS — D519 Vitamin B12 deficiency anemia, unspecified: Secondary | ICD-10-CM | POA: Insufficient documentation

## 2020-01-13 DIAGNOSIS — D649 Anemia, unspecified: Secondary | ICD-10-CM

## 2020-01-13 MED ORDER — CYANOCOBALAMIN 1000 MCG/ML IJ SOLN
1000.0000 ug | Freq: Once | INTRAMUSCULAR | Status: AC
  Administered 2020-01-13: 1000 ug via INTRAMUSCULAR
  Filled 2020-01-13: qty 1

## 2020-02-16 ENCOUNTER — Inpatient Hospital Stay: Payer: Medicare HMO

## 2020-02-17 ENCOUNTER — Other Ambulatory Visit: Payer: Self-pay

## 2020-02-17 ENCOUNTER — Inpatient Hospital Stay: Payer: Medicare HMO | Attending: Oncology

## 2020-03-15 ENCOUNTER — Inpatient Hospital Stay: Payer: Medicare HMO

## 2020-03-16 ENCOUNTER — Inpatient Hospital Stay: Payer: Medicare HMO | Attending: Oncology

## 2020-03-16 DIAGNOSIS — E538 Deficiency of other specified B group vitamins: Secondary | ICD-10-CM | POA: Insufficient documentation

## 2020-03-16 DIAGNOSIS — D649 Anemia, unspecified: Secondary | ICD-10-CM

## 2020-03-16 MED ORDER — CYANOCOBALAMIN 1000 MCG/ML IJ SOLN
1000.0000 ug | Freq: Once | INTRAMUSCULAR | Status: AC
  Administered 2020-03-16: 1000 ug via INTRAMUSCULAR
  Filled 2020-03-16: qty 1

## 2020-04-12 ENCOUNTER — Inpatient Hospital Stay: Payer: Medicare HMO

## 2020-04-13 ENCOUNTER — Inpatient Hospital Stay: Payer: Medicare HMO | Attending: Oncology

## 2020-04-13 DIAGNOSIS — E538 Deficiency of other specified B group vitamins: Secondary | ICD-10-CM | POA: Diagnosis not present

## 2020-04-13 DIAGNOSIS — D649 Anemia, unspecified: Secondary | ICD-10-CM

## 2020-04-13 MED ORDER — CYANOCOBALAMIN 1000 MCG/ML IJ SOLN
1000.0000 ug | Freq: Once | INTRAMUSCULAR | Status: AC
  Administered 2020-04-13: 1000 ug via INTRAMUSCULAR
  Filled 2020-04-13: qty 1

## 2020-05-17 ENCOUNTER — Inpatient Hospital Stay: Payer: Medicare HMO

## 2020-05-18 ENCOUNTER — Other Ambulatory Visit: Payer: Self-pay

## 2020-05-18 ENCOUNTER — Inpatient Hospital Stay: Payer: Medicare HMO | Attending: Oncology

## 2020-05-18 DIAGNOSIS — E538 Deficiency of other specified B group vitamins: Secondary | ICD-10-CM | POA: Insufficient documentation

## 2020-05-18 DIAGNOSIS — Z79899 Other long term (current) drug therapy: Secondary | ICD-10-CM | POA: Insufficient documentation

## 2020-05-18 DIAGNOSIS — D649 Anemia, unspecified: Secondary | ICD-10-CM

## 2020-05-18 MED ORDER — CYANOCOBALAMIN 1000 MCG/ML IJ SOLN
1000.0000 ug | Freq: Once | INTRAMUSCULAR | Status: AC
  Administered 2020-05-18: 1000 ug via INTRAMUSCULAR
  Filled 2020-05-18: qty 1

## 2020-06-13 ENCOUNTER — Other Ambulatory Visit: Payer: Medicare HMO

## 2020-06-14 ENCOUNTER — Ambulatory Visit: Payer: Medicare HMO

## 2020-06-14 ENCOUNTER — Ambulatory Visit: Payer: Medicare HMO | Admitting: Oncology

## 2020-06-14 ENCOUNTER — Inpatient Hospital Stay: Payer: Medicare HMO | Attending: Oncology

## 2020-06-14 DIAGNOSIS — R700 Elevated erythrocyte sedimentation rate: Secondary | ICD-10-CM | POA: Insufficient documentation

## 2020-06-14 DIAGNOSIS — Z79899 Other long term (current) drug therapy: Secondary | ICD-10-CM | POA: Diagnosis not present

## 2020-06-14 DIAGNOSIS — Z87891 Personal history of nicotine dependence: Secondary | ICD-10-CM | POA: Insufficient documentation

## 2020-06-14 DIAGNOSIS — D519 Vitamin B12 deficiency anemia, unspecified: Secondary | ICD-10-CM | POA: Insufficient documentation

## 2020-06-14 DIAGNOSIS — D649 Anemia, unspecified: Secondary | ICD-10-CM

## 2020-06-14 LAB — CBC WITH DIFFERENTIAL/PLATELET
Abs Immature Granulocytes: 0.02 10*3/uL (ref 0.00–0.07)
Basophils Absolute: 0.1 10*3/uL (ref 0.0–0.1)
Basophils Relative: 1 %
Eosinophils Absolute: 0.2 10*3/uL (ref 0.0–0.5)
Eosinophils Relative: 2 %
HCT: 34.5 % — ABNORMAL LOW (ref 36.0–46.0)
Hemoglobin: 11.5 g/dL — ABNORMAL LOW (ref 12.0–15.0)
Immature Granulocytes: 0 %
Lymphocytes Relative: 28 %
Lymphs Abs: 1.9 10*3/uL (ref 0.7–4.0)
MCH: 32.6 pg (ref 26.0–34.0)
MCHC: 33.3 g/dL (ref 30.0–36.0)
MCV: 97.7 fL (ref 80.0–100.0)
Monocytes Absolute: 0.6 10*3/uL (ref 0.1–1.0)
Monocytes Relative: 9 %
Neutro Abs: 4 10*3/uL (ref 1.7–7.7)
Neutrophils Relative %: 60 %
Platelets: 282 10*3/uL (ref 150–400)
RBC: 3.53 MIL/uL — ABNORMAL LOW (ref 3.87–5.11)
RDW: 12.8 % (ref 11.5–15.5)
WBC: 6.8 10*3/uL (ref 4.0–10.5)
nRBC: 0 % (ref 0.0–0.2)

## 2020-06-14 LAB — VITAMIN B12: Vitamin B-12: 412 pg/mL (ref 180–914)

## 2020-06-15 ENCOUNTER — Inpatient Hospital Stay (HOSPITAL_BASED_OUTPATIENT_CLINIC_OR_DEPARTMENT_OTHER): Payer: Medicare HMO | Admitting: Oncology

## 2020-06-15 ENCOUNTER — Inpatient Hospital Stay: Payer: Medicare HMO

## 2020-06-15 VITALS — BP 113/65 | HR 74 | Temp 97.8°F | Resp 20 | Wt 145.6 lb

## 2020-06-15 DIAGNOSIS — D649 Anemia, unspecified: Secondary | ICD-10-CM

## 2020-06-15 DIAGNOSIS — D519 Vitamin B12 deficiency anemia, unspecified: Secondary | ICD-10-CM | POA: Diagnosis not present

## 2020-06-15 DIAGNOSIS — Z79899 Other long term (current) drug therapy: Secondary | ICD-10-CM | POA: Diagnosis not present

## 2020-06-15 DIAGNOSIS — R700 Elevated erythrocyte sedimentation rate: Secondary | ICD-10-CM | POA: Diagnosis not present

## 2020-06-15 DIAGNOSIS — Z87891 Personal history of nicotine dependence: Secondary | ICD-10-CM | POA: Diagnosis not present

## 2020-06-15 MED ORDER — CYANOCOBALAMIN 1000 MCG/ML IJ SOLN
1000.0000 ug | Freq: Once | INTRAMUSCULAR | Status: AC
  Administered 2020-06-15: 1000 ug via INTRAMUSCULAR
  Filled 2020-06-15: qty 1

## 2020-06-15 NOTE — Progress Notes (Signed)
Patient denies any concerns today.  

## 2020-06-15 NOTE — Progress Notes (Signed)
[facility]  [phone] Fax:(336) [phone]  ID: Tammy Shelton OB: 1950-12-15  MR#: [id number]  CNO#:[id number]  Patient Care Team: Idelle Crouch, MD as PCP - General (Internal Medicine)  CHIEF COMPLAINT: Anemia secondary to B12 deficiency.  INTERVAL HISTORY: Patient returns to clinic today for repeat laboratory work and continuation of B12 injections.  She last received B12 on 05/18/2020.  Her energy levels have improved and she does not complain of any additional weakness or fatigue.  She has questions about transitioning from B12 injections to oral supplements.  She has no prior history of GI surgeries or malabsorption. She has good appetite and denies weight loss.  She has no neurologic complaints.  She denies any recent fevers or illnesses.  She has no chest pain, shortness of breath, cough, or hemoptysis.  She denies any nausea, vomiting, constipation, or diarrhea.  She has no melena or hematochezia.  She has no urinary complaints.  Patient offers no specific complaints today.  REVIEW OF SYSTEMS:   Review of Systems  Constitutional: Negative.  Negative for fever, malaise/fatigue and weight loss.  Respiratory: Negative.  Negative for cough, hemoptysis and shortness of breath.   Cardiovascular: Negative.  Negative for chest pain and leg swelling.  Gastrointestinal: Negative.  Negative for abdominal pain, blood in stool and melena.  Genitourinary: Negative.  Negative for hematuria.  Musculoskeletal: Negative.  Negative for back pain.  Skin: Negative.  Negative for rash.  Neurological: Negative.  Negative for dizziness, focal weakness, weakness and headaches.  Psychiatric/Behavioral: Negative.  The patient is not nervous/anxious.     As per HPI. Otherwise, a complete review of systems is negative.  PAST MEDICAL HISTORY: Past Medical History:  Diagnosis Date  . Hypertension     PAST SURGICAL HISTORY: Past Surgical History:  Procedure  Laterality Date  . CHOLECYSTECTOMY      FAMILY HISTORY: Family History  Problem Relation Age of Onset  . Breast cancer Neg Hx     ADVANCED DIRECTIVES (Y/N):  N  HEALTH MAINTENANCE: Social History   Tobacco Use  . Smoking status: Former Research scientist (life sciences)  . Smokeless tobacco: Never Used  Vaping Use  . Vaping Use: Never used  Substance Use Topics  . Alcohol use: Yes  . Drug use: No     Colonoscopy:  PAP:  Bone density:  Lipid panel:  No Known Allergies  Current Outpatient Medications  Medication Sig Dispense Refill  . amLODipine (NORVASC) 10 MG tablet Take 1 tablet by mouth daily.    . hydrochlorothiazide (HYDRODIURIL) 12.5 MG tablet Take 1 tablet by mouth daily.    Marland Kitchen lovastatin (MEVACOR) 20 MG tablet Take 1 tablet by mouth daily with supper.    . umeclidinium-vilanterol (ANORO ELLIPTA) 62.5-25 MCG/INH AEPB Inhale 1 puff into the lungs daily.    Marland Kitchen losartan (COZAAR) 50 MG tablet Take 1 tablet by mouth daily.     No current facility-administered medications for this visit.    OBJECTIVE: Vitals:   06/15/20 1020  BP: 113/65  Pulse: 74  Resp: 20  Temp: 97.8 F (36.6 C)     Body mass index is 28.44 kg/m.    ECOG FS:0 - Asymptomatic  Physical Exam Constitutional:      General: Vital signs are normal.     Appearance: Normal appearance.  HENT:     Head: Normocephalic and atraumatic.  Eyes:     Pupils: Pupils are equal, round, and reactive to light.  Cardiovascular:     Rate and Rhythm:  Normal rate and regular rhythm.     Heart sounds: Normal heart sounds. No murmur heard.   Pulmonary:     Effort: Pulmonary effort is normal.     Breath sounds: Normal breath sounds. No wheezing.  Abdominal:     General: Bowel sounds are normal. There is no distension.     Palpations: Abdomen is soft.     Tenderness: There is no abdominal tenderness.  Musculoskeletal:        General: No edema. Normal range of motion.     Cervical back: Normal range of motion.  Skin:    General:  Skin is warm and dry.     Findings: No rash.  Neurological:     Mental Status: She is alert and oriented to person, place, and time.  Psychiatric:        Judgment: Judgment normal.     LAB RESULTS:  Lab Results  Component Value Date   NA 135 09/14/2014   K 4.9 09/14/2014   CL 97 (L) 09/14/2014   CO2 29 09/14/2014   GLUCOSE 80 09/14/2014   BUN 16 09/14/2014   CREATININE 1.01 (H) 09/14/2014   CALCIUM 10.5 (H) 09/14/2014   GFRNONAA 58 (L) 09/14/2014   GFRAA >60 09/14/2014    Lab Results  Component Value Date   WBC 6.8 06/14/2020   NEUTROABS 4.0 06/14/2020   HGB 11.5 (L) 06/14/2020   HCT 34.5 (L) 06/14/2020   MCV 97.7 06/14/2020   PLT 282 06/14/2020     STUDIES: No results found.  ASSESSMENT: Anemia secondary to B12 deficiency.  PLAN:   1.  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 -Labs from 06/14/2020 show a vitamin B-12 level of 412. -Initial work-up from 07/29/2019 was fairly unremarkable.  Had elevated sed rate.  Normal SPEP.  Haptoglobin slightly elevated at 362. -She does not require bone marrow biopsy at this time. -Continue B12 supplementation with either B12 injections or oral supplementation  2.  B12 deficiency:  -Vitamin B12 from 06/14/2020 is 412. -She would like to start oral b12- Recommend 36m B12 daily.  -We will recheck B12 levels in 3 months.  3.  History of pleural effusion:  -Status post thoracentesis on 06/15/2019. -Fluid was tested and was negative for malignancy. -Follow-up with pulmonology.  4.  Elevated sed rate:  -Secondary to chronic inflammation. -Appears to be trending down. -Labs from 12/05/19 showed a sed rate of 80 (119).  Disposition: -B12 injection today; start oral B12 supplementation -RTC and 3 months with repeat labs (CBC and vitamin B12 level). -RTC in 6 months with repeat labs (CBC and vitamin B12 level) and MD assessment.  Greater than 50% was spent in counseling and  coordination of care with this patient including but not limited to discussion of the relevant topics above (See A&P) including, but not limited to diagnosis and management of acute and chronic medical conditions.    Patient expressed understanding and was in agreement with this plan. She also understands that She can call clinic at any time with any questions, concerns, or complaints.    JJacquelin Hawking NP   06/15/2020 1:17 PM

## 2020-06-22 DIAGNOSIS — Z1231 Encounter for screening mammogram for malignant neoplasm of breast: Secondary | ICD-10-CM | POA: Diagnosis not present

## 2020-06-22 DIAGNOSIS — Z1211 Encounter for screening for malignant neoplasm of colon: Secondary | ICD-10-CM | POA: Diagnosis not present

## 2020-06-22 DIAGNOSIS — E559 Vitamin D deficiency, unspecified: Secondary | ICD-10-CM | POA: Diagnosis not present

## 2020-06-22 DIAGNOSIS — J90 Pleural effusion, not elsewhere classified: Secondary | ICD-10-CM | POA: Diagnosis not present

## 2020-06-22 DIAGNOSIS — N189 Chronic kidney disease, unspecified: Secondary | ICD-10-CM | POA: Diagnosis not present

## 2020-06-22 DIAGNOSIS — Z0000 Encounter for general adult medical examination without abnormal findings: Secondary | ICD-10-CM | POA: Diagnosis not present

## 2020-06-22 DIAGNOSIS — Z8639 Personal history of other endocrine, nutritional and metabolic disease: Secondary | ICD-10-CM | POA: Diagnosis not present

## 2020-06-22 DIAGNOSIS — I129 Hypertensive chronic kidney disease with stage 1 through stage 4 chronic kidney disease, or unspecified chronic kidney disease: Secondary | ICD-10-CM | POA: Diagnosis not present

## 2020-06-22 DIAGNOSIS — E785 Hyperlipidemia, unspecified: Secondary | ICD-10-CM | POA: Diagnosis not present

## 2020-06-22 DIAGNOSIS — Z79899 Other long term (current) drug therapy: Secondary | ICD-10-CM | POA: Diagnosis not present

## 2020-07-04 DIAGNOSIS — Z1211 Encounter for screening for malignant neoplasm of colon: Secondary | ICD-10-CM | POA: Diagnosis not present

## 2020-07-27 DIAGNOSIS — H25013 Cortical age-related cataract, bilateral: Secondary | ICD-10-CM | POA: Diagnosis not present

## 2020-08-17 ENCOUNTER — Other Ambulatory Visit: Payer: Self-pay

## 2020-08-17 ENCOUNTER — Ambulatory Visit
Admission: RE | Admit: 2020-08-17 | Discharge: 2020-08-17 | Disposition: A | Discharge status: Home / Self Care | Payer: Medicare HMO | Source: Ambulatory Visit | Attending: Internal Medicine | Admitting: Internal Medicine

## 2020-08-17 DIAGNOSIS — Z1231 Encounter for screening mammogram for malignant neoplasm of breast: Secondary | ICD-10-CM | POA: Insufficient documentation

## 2020-09-04 ENCOUNTER — Other Ambulatory Visit: Payer: Self-pay

## 2020-09-04 ENCOUNTER — Other Ambulatory Visit: Payer: Self-pay | Admitting: Physician Assistant

## 2020-09-04 ENCOUNTER — Ambulatory Visit
Admission: RE | Admit: 2020-09-04 | Discharge: 2020-09-04 | Disposition: A | Discharge status: Home / Self Care | Payer: Medicare HMO | Source: Ambulatory Visit | Attending: Physician Assistant | Admitting: Physician Assistant

## 2020-09-04 DIAGNOSIS — M48061 Spinal stenosis, lumbar region without neurogenic claudication: Secondary | ICD-10-CM | POA: Diagnosis not present

## 2020-09-04 DIAGNOSIS — S22000S Wedge compression fracture of unspecified thoracic vertebra, sequela: Secondary | ICD-10-CM

## 2020-09-04 DIAGNOSIS — N1832 Chronic kidney disease, stage 3b: Secondary | ICD-10-CM | POA: Diagnosis not present

## 2020-09-04 DIAGNOSIS — Z8781 Personal history of (healed) traumatic fracture: Secondary | ICD-10-CM | POA: Diagnosis not present

## 2020-09-04 DIAGNOSIS — S22000D Wedge compression fracture of unspecified thoracic vertebra, subsequent encounter for fracture with routine healing: Secondary | ICD-10-CM | POA: Diagnosis not present

## 2020-09-04 DIAGNOSIS — M545 Low back pain, unspecified: Secondary | ICD-10-CM | POA: Diagnosis not present

## 2020-09-07 DIAGNOSIS — M5489 Other dorsalgia: Secondary | ICD-10-CM | POA: Diagnosis not present

## 2020-09-07 DIAGNOSIS — S22000D Wedge compression fracture of unspecified thoracic vertebra, subsequent encounter for fracture with routine healing: Secondary | ICD-10-CM | POA: Diagnosis not present

## 2020-09-07 DIAGNOSIS — J90 Pleural effusion, not elsewhere classified: Secondary | ICD-10-CM | POA: Diagnosis not present

## 2020-09-07 DIAGNOSIS — G8929 Other chronic pain: Secondary | ICD-10-CM | POA: Diagnosis not present

## 2020-09-08 DIAGNOSIS — M13862 Other specified arthritis, left knee: Secondary | ICD-10-CM | POA: Diagnosis not present

## 2020-09-08 DIAGNOSIS — S8392XA Sprain of unspecified site of left knee, initial encounter: Secondary | ICD-10-CM | POA: Diagnosis not present

## 2020-09-12 ENCOUNTER — Inpatient Hospital Stay: Payer: Medicare HMO | Attending: Oncology

## 2020-09-12 ENCOUNTER — Other Ambulatory Visit: Payer: Self-pay

## 2020-09-12 ENCOUNTER — Encounter: Payer: Self-pay | Admitting: Oncology

## 2020-09-12 ENCOUNTER — Inpatient Hospital Stay: Payer: Medicare HMO | Admitting: Oncology

## 2020-09-12 VITALS — BP 108/70 | HR 73 | Temp 98.0°F | Resp 20 | Wt 144.4 lb

## 2020-09-12 DIAGNOSIS — M899 Disorder of bone, unspecified: Secondary | ICD-10-CM | POA: Insufficient documentation

## 2020-09-12 DIAGNOSIS — D649 Anemia, unspecified: Secondary | ICD-10-CM

## 2020-09-12 DIAGNOSIS — Z87891 Personal history of nicotine dependence: Secondary | ICD-10-CM | POA: Insufficient documentation

## 2020-09-12 DIAGNOSIS — E871 Hypo-osmolality and hyponatremia: Secondary | ICD-10-CM | POA: Diagnosis not present

## 2020-09-12 DIAGNOSIS — C799 Secondary malignant neoplasm of unspecified site: Secondary | ICD-10-CM | POA: Diagnosis not present

## 2020-09-12 DIAGNOSIS — D519 Vitamin B12 deficiency anemia, unspecified: Secondary | ICD-10-CM | POA: Insufficient documentation

## 2020-09-12 DIAGNOSIS — N289 Disorder of kidney and ureter, unspecified: Secondary | ICD-10-CM | POA: Diagnosis not present

## 2020-09-12 LAB — CBC WITH DIFFERENTIAL/PLATELET
Abs Immature Granulocytes: 0.01 10*3/uL (ref 0.00–0.07)
Basophils Absolute: 0.1 10*3/uL (ref 0.0–0.1)
Basophils Relative: 1 %
Eosinophils Absolute: 0.1 10*3/uL (ref 0.0–0.5)
Eosinophils Relative: 1 %
HCT: 34.7 % — ABNORMAL LOW (ref 36.0–46.0)
Hemoglobin: 11.5 g/dL — ABNORMAL LOW (ref 12.0–15.0)
Immature Granulocytes: 0 %
Lymphocytes Relative: 26 %
Lymphs Abs: 1.8 10*3/uL (ref 0.7–4.0)
MCH: 31.7 pg (ref 26.0–34.0)
MCHC: 33.1 g/dL (ref 30.0–36.0)
MCV: 95.6 fL (ref 80.0–100.0)
Monocytes Absolute: 0.7 10*3/uL (ref 0.1–1.0)
Monocytes Relative: 9 %
Neutro Abs: 4.5 10*3/uL (ref 1.7–7.7)
Neutrophils Relative %: 63 %
Platelets: 296 10*3/uL (ref 150–400)
RBC: 3.63 MIL/uL — ABNORMAL LOW (ref 3.87–5.11)
RDW: 12.8 % (ref 11.5–15.5)
WBC: 7.1 10*3/uL (ref 4.0–10.5)
nRBC: 0 % (ref 0.0–0.2)

## 2020-09-12 LAB — COMPREHENSIVE METABOLIC PANEL
ALT: 32 U/L (ref 0–44)
AST: 43 U/L — ABNORMAL HIGH (ref 15–41)
Albumin: 3.9 g/dL (ref 3.5–5.0)
Alkaline Phosphatase: 116 U/L (ref 38–126)
Anion gap: 9 (ref 5–15)
BUN: 23 mg/dL (ref 8–23)
CO2: 28 mmol/L (ref 22–32)
Calcium: 10.2 mg/dL (ref 8.9–10.3)
Chloride: 93 mmol/L — ABNORMAL LOW (ref 98–111)
Creatinine, Ser: 1.18 mg/dL — ABNORMAL HIGH (ref 0.44–1.00)
GFR, Estimated: 50 mL/min — ABNORMAL LOW (ref >60)
Glucose, Bld: 92 mg/dL (ref 70–99)
Potassium: 3.8 mmol/L (ref 3.5–5.1)
Sodium: 130 mmol/L — ABNORMAL LOW (ref 135–145)
Total Bilirubin: 0.8 mg/dL (ref 0.3–1.2)
Total Protein: 8.1 g/dL (ref 6.5–8.1)

## 2020-09-12 LAB — VITAMIN B12: Vitamin B-12: 4412 pg/mL — ABNORMAL HIGH (ref 180–914)

## 2020-09-12 NOTE — Progress Notes (Signed)
Patient here for results from MRI she reports a recent knee injury and is currently being treated with rest and pain medications.

## 2020-09-12 NOTE — Progress Notes (Signed)
Tammy Shelton  [phone] [phone]  ID: Tammy Shelton OB: February 03, 1951  MR#: [id number]  QBH#:[id number]  Patient Care Team: Idelle Crouch, MD as PCP - General (Internal Medicine)  CHIEF COMPLAINT: Anemia secondary to B12 deficiency, now with bony lesions concerning for metastatic disease.  INTERVAL HISTORY: Patient returns to clinic as an add-on after MRI to evaluate back pain revealed multiple bony lesions concerning for metastatic disease.  She otherwise feels well.  She does not complain of any weakness or fatigue.  She has a good appetite and denies weight loss.  She has no neurologic complaints.  She denies any recent fevers or illnesses.  She has no chest pain, shortness of breath, cough, or hemoptysis.  She denies any nausea, vomiting, constipation, or diarrhea.  She has no melena or hematochezia.  She has no urinary complaints.  Patient offers no further specific complaints today.  REVIEW OF SYSTEMS:   Review of Systems  Constitutional: Negative.  Negative for fever, malaise/fatigue and weight loss.  Respiratory: Negative.  Negative for cough, hemoptysis and shortness of breath.   Cardiovascular: Negative.  Negative for chest pain and leg swelling.  Gastrointestinal: Negative.  Negative for abdominal pain, blood in stool and melena.  Genitourinary: Negative.  Negative for hematuria.  Musculoskeletal: Positive for back pain.  Skin: Negative.  Negative for rash.  Neurological: Negative.  Negative for dizziness, focal weakness, weakness and headaches.  Psychiatric/Behavioral: Negative.  The patient is not nervous/anxious.     As per HPI. Otherwise, a complete review of systems is negative.  PAST MEDICAL HISTORY: Past Medical History:  Diagnosis Date  . Hypertension     PAST SURGICAL HISTORY: Past Surgical History:  Procedure Laterality Date  . CHOLECYSTECTOMY      FAMILY HISTORY: Family History  Problem Relation Age of Onset   . Breast cancer Neg Hx     ADVANCED DIRECTIVES (Y/N):  N  HEALTH MAINTENANCE: Social History   Tobacco Use  . Smoking status: Former Research scientist (life sciences)  . Smokeless tobacco: Never Used  Vaping Use  . Vaping Use: Never used  Substance Use Topics  . Alcohol use: Yes  . Drug use: No     Colonoscopy:  PAP:  Bone density:  Lipid panel:  No Known Allergies  Current Outpatient Medications  Medication Sig Dispense Refill  . amLODipine (NORVASC) 10 MG tablet Take 1 tablet by mouth daily.    . hydrochlorothiazide (HYDRODIURIL) 12.5 MG tablet Take 1 tablet by mouth daily.    Marland Kitchen losartan (COZAAR) 50 MG tablet Take 1 tablet by mouth daily.    Marland Kitchen lovastatin (MEVACOR) 20 MG tablet Take 1 tablet by mouth daily with supper.    Marland Kitchen oxyCODONE-acetaminophen (PERCOCET) 10-325 MG tablet Take 1 tablet by mouth every 6 (six) hours as needed.    . umeclidinium-vilanterol (ANORO ELLIPTA) 62.5-25 MCG/INH AEPB Inhale 1 puff into the lungs daily.     No current facility-administered medications for this visit.    OBJECTIVE: Vitals:   09/12/20 1030  BP: 108/70  Pulse: 73  Resp: 20  Temp: 98 F (36.7 C)     Body mass index is 28.2 kg/m.    ECOG FS:0 - Asymptomatic  General: Well-developed, well-nourished, no acute distress. Eyes: Pink conjunctiva, anicteric sclera. HEENT: Normocephalic, moist mucous membranes. Lungs: No audible wheezing or coughing. Heart: Regular rate and rhythm. Abdomen: Soft, nontender, no obvious distention. Musculoskeletal: No edema, cyanosis, or clubbing. Neuro: Alert, answering all questions appropriately. Cranial nerves grossly intact. Skin:  No rashes or petechiae noted. Psych: Normal affect.  LAB RESULTS:  Lab Results  Component Value Date   NA 130 (L) 09/12/2020   K 3.8 09/12/2020   CL 93 (L) 09/12/2020   CO2 28 09/12/2020   GLUCOSE 92 09/12/2020   BUN 23 09/12/2020   CREATININE 1.18 (H) 09/12/2020   CALCIUM 10.2 09/12/2020   PROT 8.1 09/12/2020   ALBUMIN 3.9  09/12/2020   AST 43 (H) 09/12/2020   ALT 32 09/12/2020   ALKPHOS 116 09/12/2020   BILITOT 0.8 09/12/2020   GFRNONAA 50 (L) 09/12/2020   GFRAA >60 09/14/2014    Lab Results  Component Value Date   WBC 7.1 09/12/2020   NEUTROABS 4.5 09/12/2020   HGB 11.5 (L) 09/12/2020   HCT 34.7 (L) 09/12/2020   MCV 95.6 09/12/2020   PLT 296 09/12/2020     STUDIES: MR THORACIC SPINE WO CONTRAST  Result Date: 09/04/2020 CLINICAL DATA:  Chronic back pain EXAM: MRI THORACIC AND LUMBAR SPINE WITHOUT CONTRAST TECHNIQUE: Multiplanar and multiecho pulse sequences of the thoracic and lumbar spine were obtained without intravenous contrast. COMPARISON:  None. FINDINGS: MRI THORACIC SPINE FINDINGS Alignment:  Increased thoracic kyphosis centered at the T6-7 level. Vertebrae: There is abnormal signal at multiple levels of the thoracic spine, greatest at T2 and T5-8. Height loss is greatest at T7. Mild height loss at T5, T6 and T8. Cord: The thecal sac is completely effaced at the T5-6 and T7-8 levels with mild mass effect on the spinal cord. No cord signal change. Paraspinal and other soft tissues: Large area of abnormality in the right lower lobe. Multiple nodular opacities incompletely visualized in the left lung. Disc levels: T4-5: Severe spinal canal stenosis with effacement of the thecal sac due to encroachment by circumferential osseous lesion of T5. T5-6: Mild spinal canal stenosis and moderate bilateral foraminal stenosis due to encroachment by T5 lesion. T6-7: There is only mild spinal canal narrowing at the actual disc level. But, at the level of the T7 vertebral body, there is severe spinal canal narrowing with effacement of the ventral thecal sac. This is due to circumferential osseous lesion at T7. Both neural foramina are narrowed. T9-10: Small disc bulge without stenosis. MRI LUMBAR SPINE FINDINGS Segmentation:  Standard. Alignment:  Grade 2 anterolisthesis at L5-S1 Vertebrae: Heterogeneous lesion in the S1  body. Mild height loss at L1. No lumbar vertebral body marrow abnormality. Conus medullaris and cauda equina: Conus extends to the L1 level. Conus and cauda equina appear normal. Paraspinal and other soft tissues: Negative Disc levels: L1-L2: Small disc bulge. Mild facet hypertrophy. No spinal canal stenosis. Mild bilateral neural foraminal stenosis. L2-L3: Small disc bulge. No spinal canal stenosis. No neural foraminal stenosis. L3-L4: Normal disc space and facet joints. No spinal canal stenosis. No neural foraminal stenosis. L4-L5: Small central disc extrusion with inferior migration. No spinal canal stenosis. No neural foraminal stenosis. L5-S1: Grade 2 anterolisthesis due to bilateral pars interarticularis defects. No spinal canal stenosis. Severe bilateral neural foraminal stenosis. Visualized sacrum: Normal. IMPRESSION: 1. Multilevel vertebral body bone marrow signal abnormality of the upper thoracic spine, predominantly at T5-8. At the T5 and T7 levels there are circumferential marrow abnormalities that cause effacement of the thecal sac with mass effect on the spinal cord. The findings are favored to indicate metastatic disease. 2. Heterogeneous lesion in the S1 body and at multiple sites within the thoracic spine other than those described above, possibly metastatic disease. There is sparing of the lumbar spine. 3.  Grade 2 L5-S1 anterolisthesis due to bilateral pars interarticularis defects. Severe bilateral neural foraminal stenosis. 4. Large area of abnormality in the right lung base, incompletely visualized. Multiple nodular opacities incompletely visualized in the left lung. Electronically Signed   By: Ulyses Jarred M.D.   On: 09/04/2020 21:58   MR LUMBAR SPINE WO CONTRAST  Result Date: 09/04/2020 CLINICAL DATA:  Chronic back pain EXAM: MRI THORACIC AND LUMBAR SPINE WITHOUT CONTRAST TECHNIQUE: Multiplanar and multiecho pulse sequences of the thoracic and lumbar spine were obtained without intravenous  contrast. COMPARISON:  None. FINDINGS: MRI THORACIC SPINE FINDINGS Alignment:  Increased thoracic kyphosis centered at the T6-7 level. Vertebrae: There is abnormal signal at multiple levels of the thoracic spine, greatest at T2 and T5-8. Height loss is greatest at T7. Mild height loss at T5, T6 and T8. Cord: The thecal sac is completely effaced at the T5-6 and T7-8 levels with mild mass effect on the spinal cord. No cord signal change. Paraspinal and other soft tissues: Large area of abnormality in the right lower lobe. Multiple nodular opacities incompletely visualized in the left lung. Disc levels: T4-5: Severe spinal canal stenosis with effacement of the thecal sac due to encroachment by circumferential osseous lesion of T5. T5-6: Mild spinal canal stenosis and moderate bilateral foraminal stenosis due to encroachment by T5 lesion. T6-7: There is only mild spinal canal narrowing at the actual disc level. But, at the level of the T7 vertebral body, there is severe spinal canal narrowing with effacement of the ventral thecal sac. This is due to circumferential osseous lesion at T7. Both neural foramina are narrowed. T9-10: Small disc bulge without stenosis. MRI LUMBAR SPINE FINDINGS Segmentation:  Standard. Alignment:  Grade 2 anterolisthesis at L5-S1 Vertebrae: Heterogeneous lesion in the S1 body. Mild height loss at L1. No lumbar vertebral body marrow abnormality. Conus medullaris and cauda equina: Conus extends to the L1 level. Conus and cauda equina appear normal. Paraspinal and other soft tissues: Negative Disc levels: L1-L2: Small disc bulge. Mild facet hypertrophy. No spinal canal stenosis. Mild bilateral neural foraminal stenosis. L2-L3: Small disc bulge. No spinal canal stenosis. No neural foraminal stenosis. L3-L4: Normal disc space and facet joints. No spinal canal stenosis. No neural foraminal stenosis. L4-L5: Small central disc extrusion with inferior migration. No spinal canal stenosis. No neural  foraminal stenosis. L5-S1: Grade 2 anterolisthesis due to bilateral pars interarticularis defects. No spinal canal stenosis. Severe bilateral neural foraminal stenosis. Visualized sacrum: Normal. IMPRESSION: 1. Multilevel vertebral body bone marrow signal abnormality of the upper thoracic spine, predominantly at T5-8. At the T5 and T7 levels there are circumferential marrow abnormalities that cause effacement of the thecal sac with mass effect on the spinal cord. The findings are favored to indicate metastatic disease. 2. Heterogeneous lesion in the S1 body and at multiple sites within the thoracic spine other than those described above, possibly metastatic disease. There is sparing of the lumbar spine. 3. Grade 2 L5-S1 anterolisthesis due to bilateral pars interarticularis defects. Severe bilateral neural foraminal stenosis. 4. Large area of abnormality in the right lung base, incompletely visualized. Multiple nodular opacities incompletely visualized in the left lung. Electronically Signed   By: Ulyses Jarred M.D.   On: 09/04/2020 21:58   MM 3D SCREEN BREAST BILATERAL  Result Date: 08/20/2020 CLINICAL DATA:  Screening. EXAM: DIGITAL SCREENING BILATERAL MAMMOGRAM WITH TOMOSYNTHESIS AND CAD TECHNIQUE: Bilateral screening digital craniocaudal and mediolateral oblique mammograms were obtained. Bilateral screening digital breast tomosynthesis was performed. The images were evaluated with  computer-aided detection. COMPARISON:  Previous exam(s). ACR Breast Density Category b: There are scattered areas of fibroglandular density. FINDINGS: There are no findings suspicious for malignancy. The images were evaluated with computer-aided detection. IMPRESSION: No mammographic evidence of malignancy. A result letter of this screening mammogram will be mailed directly to the patient. RECOMMENDATION: Screening mammogram in one year. (Code:SM-B-01Y) BI-RADS CATEGORY  1: Negative. Electronically Signed   By: Claudie Revering M.D.    On: 08/20/2020 08:38    ASSESSMENT: Anemia secondary to B12 deficiency, now with bony lesions concerning for metastatic disease.  PLAN:    1.  Bony lesions concerning for metastatic disease: MRI results reviewed independently with multiple suspicious vertebral lesions.  Have ordered a myeloma work-up for completeness and will also get CT of chest, abdomen, pelvis for further evaluation and to assess for primary.  Ultimately, patient will require biopsy if suspicion remains high for malignancy.  Return to clinic 1 to 2 days after her CT scans to discuss the results and additional diagnostic planning. 1.  Anemia secondary to B12 deficiency: Essentially resolved.  Patient's hemoglobin today is 11.5.  B12 levels are greater than 4000.  All of her other laboratory work including iron stores is either negative or within normal limits.  She does not require B12 injection today. 2.  B12 deficiency: Monthly B12 as above. 3.  History of pleural effusion: Patient underwent thoracentesis on June 15, 2019 with no evidence of malignancy.   4.  Hyponatremia: Patient's sodium is 130, monitor. 5.  Renal insufficiency: Mild, monitor.   Patient expressed understanding and was in agreement with this plan. She also understands that She can call clinic at any time with any questions, concerns, or complaints.    Lloyd Huger, MD   09/13/2020 6:17 AM

## 2020-09-13 ENCOUNTER — Other Ambulatory Visit: Payer: Self-pay

## 2020-09-13 ENCOUNTER — Ambulatory Visit
Admission: RE | Admit: 2020-09-13 | Discharge: 2020-09-13 | Disposition: A | Discharge status: Home / Self Care | Payer: Medicare HMO | Source: Ambulatory Visit | Attending: Oncology | Admitting: Oncology

## 2020-09-13 DIAGNOSIS — C7800 Secondary malignant neoplasm of unspecified lung: Secondary | ICD-10-CM | POA: Insufficient documentation

## 2020-09-13 DIAGNOSIS — C799 Secondary malignant neoplasm of unspecified site: Secondary | ICD-10-CM | POA: Insufficient documentation

## 2020-09-13 DIAGNOSIS — C7951 Secondary malignant neoplasm of bone: Secondary | ICD-10-CM | POA: Diagnosis not present

## 2020-09-13 DIAGNOSIS — I700 Atherosclerosis of aorta: Secondary | ICD-10-CM | POA: Insufficient documentation

## 2020-09-13 DIAGNOSIS — M4319 Spondylolisthesis, multiple sites in spine: Secondary | ICD-10-CM | POA: Diagnosis not present

## 2020-09-13 DIAGNOSIS — I708 Atherosclerosis of other arteries: Secondary | ICD-10-CM | POA: Diagnosis not present

## 2020-09-13 DIAGNOSIS — S22080A Wedge compression fracture of T11-T12 vertebra, initial encounter for closed fracture: Secondary | ICD-10-CM | POA: Diagnosis not present

## 2020-09-13 LAB — KAPPA/LAMBDA LIGHT CHAINS
Kappa free light chain: 55 mg/L — ABNORMAL HIGH (ref 3.3–19.4)
Kappa, lambda light chain ratio: 1.04 (ref 0.26–1.65)
Lambda free light chains: 52.9 mg/L — ABNORMAL HIGH (ref 5.7–26.3)

## 2020-09-13 LAB — IGG, IGA, IGM
IgA: 483 mg/dL — ABNORMAL HIGH (ref 87–352)
IgG (Immunoglobin G), Serum: 1054 mg/dL (ref 586–1602)
IgM (Immunoglobulin M), Srm: 39 mg/dL (ref 26–217)

## 2020-09-13 MED ORDER — IOHEXOL 300 MG/ML  SOLN
75.0000 mL | Freq: Once | INTRAMUSCULAR | Status: AC | PRN
  Administered 2020-09-13: 75 mL via INTRAVENOUS

## 2020-09-13 NOTE — Progress Notes (Deleted)
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  INTERVAL HISTORY: Patient returns to clinic as an add-on after MRI to evaluate back pain revealed multiple bony lesions concerning for metastatic disease.  She otherwise feels well.  She does not complain of any weakness or fatigue.  She has a good appetite and denies weight loss.  She has no neurologic complaints.  She denies any recent fevers or illnesses.  She has no chest pain, shortness of breath, cough, or hemoptysis.  She denies any nausea, vomiting, constipation, or diarrhea.  She has no melena or hematochezia.  She has no urinary complaints.  Patient offers no further specific complaints today.  REVIEW OF SYSTEMS:   Review of Systems  Constitutional: Negative.  Negative for fever, malaise/fatigue and weight loss.  Respiratory: Negative.  Negative for cough, hemoptysis and shortness of breath.   Cardiovascular: Negative.  Negative for chest pain and leg swelling.  Gastrointestinal: Negative.  Negative for abdominal pain, blood in stool and melena.  Genitourinary: Negative.  Negative for hematuria.  Musculoskeletal: Positive for back pain.  Skin: Negative.  Negative for rash.  Neurological: Negative.  Negative for dizziness, focal weakness, weakness and headaches.  Psychiatric/Behavioral: Negative.  The patient is not nervous/anxious.     As per HPI. Otherwise, a complete review of systems is negative.  PAST MEDICAL HISTORY: Past Medical History:  Diagnosis Date  . Hypertension     PAST SURGICAL HISTORY: Past Surgical History:  Procedure Laterality Date  . CHOLECYSTECTOMY      FAMILY HISTORY: Family History  Problem Relation Age of Onset   . Breast cancer Neg Hx     ADVANCED DIRECTIVES (Y/N):  N  HEALTH MAINTENANCE: Social History   Tobacco Use  . Smoking status: Former Research scientist (life sciences)  . Smokeless tobacco: Never Used  Vaping Use  . Vaping Use: Never used  Substance Use Topics  . Alcohol use: Yes  . Drug use: No     Colonoscopy:  PAP:  Bone density:  Lipid panel:  No Known Allergies  Current Outpatient Medications  Medication Sig Dispense Refill  . amLODipine (NORVASC) 10 MG tablet Take 1 tablet by mouth daily.    . hydrochlorothiazide (HYDRODIURIL) 12.5 MG tablet Take 1 tablet by mouth daily.    Marland Kitchen losartan (COZAAR) 50 MG tablet Take 1 tablet by mouth daily.    Marland Kitchen lovastatin (MEVACOR) 20 MG tablet Take 1 tablet by mouth daily with supper.    Marland Kitchen oxyCODONE-acetaminophen (PERCOCET) 10-325 MG tablet Take 1 tablet by mouth every 6 (six) hours as needed.    . umeclidinium-vilanterol (ANORO ELLIPTA) 62.5-25 MCG/INH AEPB Inhale 1 puff into the lungs daily.     No current facility-administered medications for this visit.    OBJECTIVE: There were no vitals filed for this visit.   There is no height or weight on file to calculate BMI.    ECOG FS:0 - Asymptomatic  General: Well-developed, well-nourished, no acute distress. Eyes: Pink conjunctiva, anicteric sclera. HEENT: Normocephalic, moist mucous membranes. Lungs: No audible wheezing or coughing. Heart: Regular rate and rhythm. Abdomen: Soft, nontender, no obvious distention. Musculoskeletal: No edema, cyanosis, or clubbing. Neuro: Alert, answering all questions appropriately. Cranial nerves grossly intact. Skin: No rashes or petechiae noted. Psych: Normal affect.  LAB RESULTS:  Lab Results  Component Value Date   NA 130 (L) 09/12/2020   K 3.8 09/12/2020   CL 93 (L) 09/12/2020   CO2 28 09/12/2020   GLUCOSE 92 09/12/2020   BUN 23 09/12/2020   CREATININE 1.18 (H) 09/12/2020   CALCIUM 10.2 09/12/2020   PROT 8.1 09/12/2020   ALBUMIN 3.9 09/12/2020   AST 43 (H)  09/12/2020   ALT 32 09/12/2020   ALKPHOS 116 09/12/2020   BILITOT 0.8 09/12/2020   GFRNONAA 50 (L) 09/12/2020   GFRAA >60 09/14/2014    Lab Results  Component Value Date   WBC 7.1 09/12/2020   NEUTROABS 4.5 09/12/2020   HGB 11.5 (L) 09/12/2020   HCT 34.7 (L) 09/12/2020   MCV 95.6 09/12/2020   PLT 296 09/12/2020     STUDIES: MR THORACIC SPINE WO CONTRAST  Result Date: 09/04/2020 CLINICAL DATA:  Chronic back pain EXAM: MRI THORACIC AND LUMBAR SPINE WITHOUT CONTRAST TECHNIQUE: Multiplanar and multiecho pulse sequences of the thoracic and lumbar spine were obtained without intravenous contrast. COMPARISON:  None. FINDINGS: MRI THORACIC SPINE FINDINGS Alignment:  Increased thoracic kyphosis centered at the T6-7 level. Vertebrae: There is abnormal signal at multiple levels of the thoracic spine, greatest at T2 and T5-8. Height loss is greatest at T7. Mild height loss at T5, T6 and T8. Cord: The thecal sac is completely effaced at the T5-6 and T7-8 levels with mild mass effect on the spinal cord. No cord signal change. Paraspinal and other soft tissues: Large area of abnormality in the right lower lobe. Multiple nodular opacities incompletely visualized in the left lung. Disc levels: T4-5: Severe spinal canal stenosis with effacement of the thecal sac due to encroachment by circumferential osseous lesion of T5. T5-6: Mild spinal canal stenosis and moderate bilateral foraminal stenosis due to encroachment by T5 lesion. T6-7: There is only mild spinal canal narrowing at the actual disc level. But, at the level of the T7 vertebral body, there is severe spinal canal narrowing with effacement of the ventral thecal sac. This is due to circumferential osseous lesion at T7. Both neural foramina are narrowed. T9-10: Small disc bulge without stenosis. MRI LUMBAR SPINE FINDINGS Segmentation:  Standard. Alignment:  Grade 2 anterolisthesis at L5-S1 Vertebrae: Heterogeneous lesion in the S1 body. Mild height loss at  L1. No lumbar vertebral body marrow abnormality. Conus medullaris and cauda equina: Conus extends to the L1 level. Conus and cauda equina appear normal. Paraspinal and other soft tissues: Negative Disc levels: L1-L2: Small disc bulge. Mild facet hypertrophy. No spinal canal stenosis. Mild bilateral neural foraminal stenosis. L2-L3: Small disc bulge. No spinal canal stenosis. No neural foraminal stenosis. L3-L4: Normal disc space and facet joints. No spinal canal stenosis. No neural foraminal stenosis. L4-L5: Small central disc extrusion with inferior migration. No spinal canal stenosis. No neural foraminal stenosis. L5-S1: Grade 2 anterolisthesis due to bilateral pars interarticularis defects. No spinal canal stenosis. Severe bilateral neural foraminal stenosis. Visualized sacrum: Normal. IMPRESSION: 1. Multilevel vertebral body bone marrow signal abnormality of the upper thoracic spine, predominantly at T5-8. At the T5 and T7 levels there are circumferential marrow abnormalities that cause effacement of the thecal sac with mass effect on the spinal cord. The findings are favored to indicate metastatic disease. 2. Heterogeneous lesion in the S1 body and at multiple sites within the thoracic spine other than those described above, possibly metastatic disease. There is sparing of the lumbar spine. 3. Grade 2 L5-S1 anterolisthesis due to bilateral pars interarticularis  defects. Severe bilateral neural foraminal stenosis. 4. Large area of abnormality in the right lung base, incompletely visualized. Multiple nodular opacities incompletely visualized in the left lung. Electronically Signed   By: Ulyses Jarred M.D.   On: 09/04/2020 21:58   MR LUMBAR SPINE WO CONTRAST  Result Date: 09/04/2020 CLINICAL DATA:  Chronic back pain EXAM: MRI THORACIC AND LUMBAR SPINE WITHOUT CONTRAST TECHNIQUE: Multiplanar and multiecho pulse sequences of the thoracic and lumbar spine were obtained without intravenous contrast. COMPARISON:   None. FINDINGS: MRI THORACIC SPINE FINDINGS Alignment:  Increased thoracic kyphosis centered at the T6-7 level. Vertebrae: There is abnormal signal at multiple levels of the thoracic spine, greatest at T2 and T5-8. Height loss is greatest at T7. Mild height loss at T5, T6 and T8. Cord: The thecal sac is completely effaced at the T5-6 and T7-8 levels with mild mass effect on the spinal cord. No cord signal change. Paraspinal and other soft tissues: Large area of abnormality in the right lower lobe. Multiple nodular opacities incompletely visualized in the left lung. Disc levels: T4-5: Severe spinal canal stenosis with effacement of the thecal sac due to encroachment by circumferential osseous lesion of T5. T5-6: Mild spinal canal stenosis and moderate bilateral foraminal stenosis due to encroachment by T5 lesion. T6-7: There is only mild spinal canal narrowing at the actual disc level. But, at the level of the T7 vertebral body, there is severe spinal canal narrowing with effacement of the ventral thecal sac. This is due to circumferential osseous lesion at T7. Both neural foramina are narrowed. T9-10: Small disc bulge without stenosis. MRI LUMBAR SPINE FINDINGS Segmentation:  Standard. Alignment:  Grade 2 anterolisthesis at L5-S1 Vertebrae: Heterogeneous lesion in the S1 body. Mild height loss at L1. No lumbar vertebral body marrow abnormality. Conus medullaris and cauda equina: Conus extends to the L1 level. Conus and cauda equina appear normal. Paraspinal and other soft tissues: Negative Disc levels: L1-L2: Small disc bulge. Mild facet hypertrophy. No spinal canal stenosis. Mild bilateral neural foraminal stenosis. L2-L3: Small disc bulge. No spinal canal stenosis. No neural foraminal stenosis. L3-L4: Normal disc space and facet joints. No spinal canal stenosis. No neural foraminal stenosis. L4-L5: Small central disc extrusion with inferior migration. No spinal canal stenosis. No neural foraminal stenosis. L5-S1:  Grade 2 anterolisthesis due to bilateral pars interarticularis defects. No spinal canal stenosis. Severe bilateral neural foraminal stenosis. Visualized sacrum: Normal. IMPRESSION: 1. Multilevel vertebral body bone marrow signal abnormality of the upper thoracic spine, predominantly at T5-8. At the T5 and T7 levels there are circumferential marrow abnormalities that cause effacement of the thecal sac with mass effect on the spinal cord. The findings are favored to indicate metastatic disease. 2. Heterogeneous lesion in the S1 body and at multiple sites within the thoracic spine other than those described above, possibly metastatic disease. There is sparing of the lumbar spine. 3. Grade 2 L5-S1 anterolisthesis due to bilateral pars interarticularis defects. Severe bilateral neural foraminal stenosis. 4. Large area of abnormality in the right lung base, incompletely visualized. Multiple nodular opacities incompletely visualized in the left lung. Electronically Signed   By: Ulyses Jarred M.D.   On: 09/04/2020 21:58   MM 3D SCREEN BREAST BILATERAL  Result Date: 08/20/2020 CLINICAL DATA:  Screening. EXAM: DIGITAL SCREENING BILATERAL MAMMOGRAM WITH TOMOSYNTHESIS AND CAD TECHNIQUE: Bilateral screening digital craniocaudal and mediolateral oblique mammograms were obtained. Bilateral screening digital breast tomosynthesis was performed. The images were evaluated with computer-aided detection. COMPARISON:  Previous exam(s). ACR Breast Density  Category b: There are scattered areas of fibroglandular density. FINDINGS: There are no findings suspicious for malignancy. The images were evaluated with computer-aided detection. IMPRESSION: No mammographic evidence of malignancy. A result letter of this screening mammogram will be mailed directly to the patient. RECOMMENDATION: Screening mammogram in one year. (Code:SM-B-01Y) BI-RADS CATEGORY  1: Negative. Electronically Signed   By: Claudie Revering M.D.   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  PLAN:    1.  Bony lesions concerning for metastatic disease: MRI results reviewed independently with multiple suspicious vertebral lesions.  Have ordered a myeloma work-up for completeness and will also get CT of chest, abdomen, pelvis for further evaluation and to assess for primary.  Ultimately, patient will require biopsy if suspicion remains high for malignancy.  Return to clinic 1 to 2 days after her CT scans to discuss the results and additional diagnostic planning. 1.  Anemia secondary to B12 deficiency: Essentially resolved.  Patient's hemoglobin today is 11.5.  B12 levels are greater than 4000.  All of her other laboratory work including iron stores is either negative or within normal limits.  She does not require B12 injection today. 2.  B12 deficiency: Monthly B12 as above. 3.  History of pleural effusion: Patient underwent thoracentesis on June 15, 2019 with no evidence of malignancy.   4.  Hyponatremia: Patient's sodium is 130, monitor. 5.  Renal insufficiency: Mild, monitor.   Patient expressed understanding and was in agreement with this plan. She also understands that She can call clinic at any time with any questions, concerns, or complaints.    Lloyd Huger, MD   09/13/2020 10:08 AM

## 2020-09-14 ENCOUNTER — Inpatient Hospital Stay: Payer: Medicare HMO

## 2020-09-14 ENCOUNTER — Encounter: Payer: Self-pay | Admitting: *Deleted

## 2020-09-14 LAB — PROTEIN ELECTROPHORESIS, SERUM
A/G Ratio: 0.9 (ref 0.7–1.7)
Albumin ELP: 3.5 g/dL (ref 2.9–4.4)
Alpha-1-Globulin: 0.4 g/dL (ref 0.0–0.4)
Alpha-2-Globulin: 1.1 g/dL — ABNORMAL HIGH (ref 0.4–1.0)
Beta Globulin: 1.3 g/dL (ref 0.7–1.3)
Gamma Globulin: 1 g/dL (ref 0.4–1.8)
Globulin, Total: 3.8 g/dL (ref 2.2–3.9)
Total Protein ELP: 7.3 g/dL (ref 6.0–8.5)

## 2020-09-14 NOTE — Patient Instructions (Signed)
Referral faxed to Dr. Lanney Gins at [facility] for bronchoscopy.

## 2020-09-17 ENCOUNTER — Telehealth: Payer: Self-pay | Admitting: *Deleted

## 2020-09-17 ENCOUNTER — Emergency Department
Admission: EM | Admit: 2020-09-17 | Discharge: 2020-09-17 | Disposition: A | Discharge status: Home / Self Care | Payer: Medicare HMO | Attending: Emergency Medicine | Admitting: Emergency Medicine

## 2020-09-17 ENCOUNTER — Other Ambulatory Visit: Payer: Self-pay

## 2020-09-17 DIAGNOSIS — R918 Other nonspecific abnormal finding of lung field: Secondary | ICD-10-CM | POA: Diagnosis not present

## 2020-09-17 DIAGNOSIS — N183 Chronic kidney disease, stage 3 unspecified: Secondary | ICD-10-CM | POA: Insufficient documentation

## 2020-09-17 DIAGNOSIS — Z79899 Other long term (current) drug therapy: Secondary | ICD-10-CM | POA: Insufficient documentation

## 2020-09-17 DIAGNOSIS — G9520 Unspecified cord compression: Secondary | ICD-10-CM | POA: Diagnosis not present

## 2020-09-17 DIAGNOSIS — J90 Pleural effusion, not elsewhere classified: Secondary | ICD-10-CM | POA: Diagnosis not present

## 2020-09-17 DIAGNOSIS — Z20822 Contact with and (suspected) exposure to covid-19: Secondary | ICD-10-CM | POA: Insufficient documentation

## 2020-09-17 DIAGNOSIS — C3490 Malignant neoplasm of unspecified part of unspecified bronchus or lung: Secondary | ICD-10-CM | POA: Diagnosis not present

## 2020-09-17 DIAGNOSIS — I129 Hypertensive chronic kidney disease with stage 1 through stage 4 chronic kidney disease, or unspecified chronic kidney disease: Secondary | ICD-10-CM | POA: Diagnosis not present

## 2020-09-17 DIAGNOSIS — R202 Paresthesia of skin: Secondary | ICD-10-CM | POA: Diagnosis not present

## 2020-09-17 DIAGNOSIS — Z981 Arthrodesis status: Secondary | ICD-10-CM | POA: Diagnosis not present

## 2020-09-17 DIAGNOSIS — D638 Anemia in other chronic diseases classified elsewhere: Secondary | ICD-10-CM | POA: Diagnosis not present

## 2020-09-17 DIAGNOSIS — G9529 Other cord compression: Secondary | ICD-10-CM | POA: Diagnosis not present

## 2020-09-17 DIAGNOSIS — M4317 Spondylolisthesis, lumbosacral region: Secondary | ICD-10-CM | POA: Diagnosis not present

## 2020-09-17 DIAGNOSIS — C3491 Malignant neoplasm of unspecified part of right bronchus or lung: Secondary | ICD-10-CM | POA: Diagnosis not present

## 2020-09-17 DIAGNOSIS — M6281 Muscle weakness (generalized): Secondary | ICD-10-CM | POA: Insufficient documentation

## 2020-09-17 DIAGNOSIS — E871 Hypo-osmolality and hyponatremia: Secondary | ICD-10-CM | POA: Diagnosis not present

## 2020-09-17 DIAGNOSIS — R531 Weakness: Secondary | ICD-10-CM | POA: Diagnosis not present

## 2020-09-17 DIAGNOSIS — M47816 Spondylosis without myelopathy or radiculopathy, lumbar region: Secondary | ICD-10-CM | POA: Diagnosis not present

## 2020-09-17 DIAGNOSIS — Z87891 Personal history of nicotine dependence: Secondary | ICD-10-CM | POA: Insufficient documentation

## 2020-09-17 DIAGNOSIS — Z428 Encounter for other plastic and reconstructive surgery following medical procedure or healed injury: Secondary | ICD-10-CM | POA: Diagnosis not present

## 2020-09-17 DIAGNOSIS — C7951 Secondary malignant neoplasm of bone: Secondary | ICD-10-CM | POA: Diagnosis not present

## 2020-09-17 DIAGNOSIS — Z743 Need for continuous supervision: Secondary | ICD-10-CM | POA: Diagnosis not present

## 2020-09-17 DIAGNOSIS — D508 Other iron deficiency anemias: Secondary | ICD-10-CM | POA: Diagnosis not present

## 2020-09-17 DIAGNOSIS — M899 Disorder of bone, unspecified: Secondary | ICD-10-CM | POA: Diagnosis not present

## 2020-09-17 DIAGNOSIS — Z01818 Encounter for other preprocedural examination: Secondary | ICD-10-CM | POA: Diagnosis not present

## 2020-09-17 DIAGNOSIS — M8588 Other specified disorders of bone density and structure, other site: Secondary | ICD-10-CM | POA: Diagnosis not present

## 2020-09-17 DIAGNOSIS — N189 Chronic kidney disease, unspecified: Secondary | ICD-10-CM | POA: Diagnosis not present

## 2020-09-17 DIAGNOSIS — D492 Neoplasm of unspecified behavior of bone, soft tissue, and skin: Secondary | ICD-10-CM | POA: Diagnosis not present

## 2020-09-17 DIAGNOSIS — M48061 Spinal stenosis, lumbar region without neurogenic claudication: Secondary | ICD-10-CM | POA: Diagnosis not present

## 2020-09-17 DIAGNOSIS — R279 Unspecified lack of coordination: Secondary | ICD-10-CM | POA: Diagnosis not present

## 2020-09-17 DIAGNOSIS — M40204 Unspecified kyphosis, thoracic region: Secondary | ICD-10-CM | POA: Diagnosis not present

## 2020-09-17 DIAGNOSIS — R5381 Other malaise: Secondary | ICD-10-CM | POA: Diagnosis not present

## 2020-09-17 DIAGNOSIS — M4804 Spinal stenosis, thoracic region: Secondary | ICD-10-CM | POA: Diagnosis not present

## 2020-09-17 DIAGNOSIS — C801 Malignant (primary) neoplasm, unspecified: Secondary | ICD-10-CM | POA: Diagnosis not present

## 2020-09-17 DIAGNOSIS — Z720 Tobacco use: Secondary | ICD-10-CM | POA: Diagnosis not present

## 2020-09-17 DIAGNOSIS — D649 Anemia, unspecified: Secondary | ICD-10-CM | POA: Diagnosis not present

## 2020-09-17 DIAGNOSIS — I639 Cerebral infarction, unspecified: Secondary | ICD-10-CM | POA: Diagnosis not present

## 2020-09-17 DIAGNOSIS — M47814 Spondylosis without myelopathy or radiculopathy, thoracic region: Secondary | ICD-10-CM | POA: Diagnosis not present

## 2020-09-17 HISTORY — DX: Hyperlipidemia, unspecified: E78.5

## 2020-09-17 LAB — CBC WITH DIFFERENTIAL/PLATELET
Abs Immature Granulocytes: 0.03 10*3/uL (ref 0.00–0.07)
Basophils Absolute: 0.1 10*3/uL (ref 0.0–0.1)
Basophils Relative: 1 %
Eosinophils Absolute: 0.1 10*3/uL (ref 0.0–0.5)
Eosinophils Relative: 1 %
HCT: 34.2 % — ABNORMAL LOW (ref 36.0–46.0)
Hemoglobin: 11.6 g/dL — ABNORMAL LOW (ref 12.0–15.0)
Immature Granulocytes: 0 %
Lymphocytes Relative: 26 %
Lymphs Abs: 1.7 10*3/uL (ref 0.7–4.0)
MCH: 32.1 pg (ref 26.0–34.0)
MCHC: 33.9 g/dL (ref 30.0–36.0)
MCV: 94.7 fL (ref 80.0–100.0)
Monocytes Absolute: 0.6 10*3/uL (ref 0.1–1.0)
Monocytes Relative: 10 %
Neutro Abs: 4.1 10*3/uL (ref 1.7–7.7)
Neutrophils Relative %: 62 %
Platelets: 302 10*3/uL (ref 150–400)
RBC: 3.61 MIL/uL — ABNORMAL LOW (ref 3.87–5.11)
RDW: 12.5 % (ref 11.5–15.5)
WBC: 6.7 10*3/uL (ref 4.0–10.5)
nRBC: 0 % (ref 0.0–0.2)

## 2020-09-17 LAB — BASIC METABOLIC PANEL
Anion gap: 6 (ref 5–15)
BUN: 15 mg/dL (ref 8–23)
CO2: 27 mmol/L (ref 22–32)
Calcium: 10.3 mg/dL (ref 8.9–10.3)
Chloride: 98 mmol/L (ref 98–111)
Creatinine, Ser: 1.28 mg/dL — ABNORMAL HIGH (ref 0.44–1.00)
GFR, Estimated: 45 mL/min — ABNORMAL LOW (ref >60)
Glucose, Bld: 98 mg/dL (ref 70–99)
Potassium: 5.2 mmol/L — ABNORMAL HIGH (ref 3.5–5.1)
Sodium: 131 mmol/L — ABNORMAL LOW (ref 135–145)

## 2020-09-17 LAB — RESP PANEL BY RT-PCR (FLU A&B, COVID) ARPGX2
Influenza A by PCR: NEGATIVE
Influenza B by PCR: NEGATIVE
SARS Coronavirus 2 by RT PCR: NEGATIVE

## 2020-09-17 MED ORDER — DEXAMETHASONE SODIUM PHOSPHATE 10 MG/ML IJ SOLN
10.0000 mg | Freq: Once | INTRAMUSCULAR | Status: AC
  Administered 2020-09-17: 10 mg via INTRAVENOUS
  Filled 2020-09-17: qty 1

## 2020-09-17 MED ORDER — DEXAMETHASONE SODIUM PHOSPHATE 4 MG/ML IJ SOLN: 4.0000 mg | Freq: Four times a day (QID) | INTRAMUSCULAR | Status: DC

## 2020-09-17 NOTE — ED Notes (Signed)
Report called tf patient transfer to [facility] rn charge  all questions answered [phone] Ed to Ed transfer , Dr Archie Balboa transferring to care of Dr Nikki Dom

## 2020-09-17 NOTE — ED Triage Notes (Signed)
Pt c/o numbness to BL LE and part of abd today, pt states she has been having back issues and had a MRI t &L spine 09/04/2020 that shows some issues, states she called her doctor today about the new sx and told to come to the ED, pt denies any loss or bowel or bladder function.

## 2020-09-17 NOTE — Telephone Encounter (Signed)
Patient advised of Dr Virgel Manifold response and she thanked me and said that what she was waiting to find out.

## 2020-09-17 NOTE — ED Notes (Signed)
Called [facility] to coordinate possible transfer for neurosurgery for Dr.Goodman. Faxed facesheet and power shared the images.Patient was accepted ED to ED transfer. Called [facility] for transport to [facility].

## 2020-09-17 NOTE — Telephone Encounter (Signed)
Patient called reporting that she has something going on with her body that her PCP wants her to go to the ER for, but she states she has an appointment with our office tomorrow and is asking if we feel she should go to ER. She is having weakness in her legs and tingling across her abdomen which started yesterday. States she is not in a terrific amount of pain. Please advise

## 2020-09-17 NOTE — ED Provider Notes (Signed)
[facility] Emergency Department Provider Note  ____________________________________________   I have reviewed the triage vital signs and the nursing notes.   HISTORY  Chief Complaint Numbness   History limited by: Not Limited   HPI Tammy Shelton is a 70 y.o. female who presents to the emergency department today at the advise of her physician because of concern for leg weakness and abdominal numbness. The patient states that her symptoms started last night. She has a hard time describing the sensation that is at her stomach. It is located across her stomach. The patient states that it has stayed about the same as it was last night. In addition she has noticed increased weakness in bilateral legs. The patient says that she has noticed it has been harder to get in and out of vehicles as well as harder to walk. The patient denies any change to bowel or bladder. She does have some continued back pain however it does not seem to be worse than it has been recently.   Records reviewed. Per medical record review patient has a history of HLD, HTN.   Past Medical History:  Diagnosis Date  . Hyperlipidemia   . Hypertension     Patient Active Problem List   Diagnosis Date Noted  . Anemia 07/29/2019  . Lung mass 04/23/2018  . History of vitamin D deficiency 02/13/2017  . Hyponatremia 01/12/2017  . Pure hypercholesterolemia 11/29/2015  . ANA positive 06/19/2015  . Arthralgia of multiple joints 06/19/2015  . CKD (chronic kidney disease) stage 3, GFR 30-59 ml/min (HCC) 06/19/2015  . Former smoker 05/29/2015  . Essential hypertension 08/23/2014    Past Surgical History:  Procedure Laterality Date  . CHOLECYSTECTOMY      Prior to Admission medications   Medication Sig Start Date End Date Taking? Authorizing Provider  amLODipine (NORVASC) 10 MG tablet Take 1 tablet by mouth daily. 02/09/19   [provider]  hydrochlorothiazide (HYDRODIURIL) 12.5 MG  tablet Take 1 tablet by mouth daily. 06/29/19   [provider]  losartan (COZAAR) 50 MG tablet Take 1 tablet by mouth daily. 02/09/19 09/12/20  [provider]  lovastatin (MEVACOR) 20 MG tablet Take 1 tablet by mouth daily with supper. 02/09/19   [provider]  oxyCODONE-acetaminophen (PERCOCET) 10-325 MG tablet Take 1 tablet by mouth every 6 (six) hours as needed. 09/07/20 10/07/20  [provider]  umeclidinium-vilanterol (ANORO ELLIPTA) 62.5-25 MCG/INH AEPB Inhale 1 puff into the lungs daily. 11/19/18   [provider]    Allergies Patient has no known allergies.  Family History  Problem Relation Age of Onset  . Breast cancer Neg Hx     Social History Social History   Tobacco Use  . Smoking status: Former Research scientist (life sciences)  . Smokeless tobacco: Never Used  Vaping Use  . Vaping Use: Never used  Substance Use Topics  . Alcohol use: Yes  . Drug use: No    Review of Systems Constitutional: No fever/chills Eyes: No visual changes. ENT: No sore throat. Cardiovascular: Denies chest pain. Respiratory: Denies shortness of breath. Gastrointestinal: No abdominal pain.  No nausea, no vomiting.  No diarrhea.   Genitourinary: Negative for dysuria. Musculoskeletal: Negative for back pain. Skin: Negative for rash. Neurological: Positive for tingling along her abdomen, positive for weakness to bilateral lower extremities.  ____________________________________________   PHYSICAL EXAM:  VITAL SIGNS: ED Triage Vitals  Enc Vitals Group     BP 09/17/20 1531 97/87     Pulse Rate 09/17/20  1531 62     Resp 09/17/20 1531 16     Temp 09/17/20 1531 98.3 F (36.8 C)     Temp Source 09/17/20 1531 Oral     SpO2 09/17/20 1531 97 %     Weight 09/17/20 1532 144 lb (65.3 kg)     Height 09/17/20 1532 5' (1.524 m)     Head Circumference --      Peak Flow --      Pain Score 09/17/20 1532 7    Constitutional: Alert and oriented.  Eyes: Conjunctivae are normal.   ENT      Head: Normocephalic and atraumatic.      Nose: No congestion/rhinnorhea.      Mouth/Throat: Mucous membranes are moist.      Neck: No stridor. Hematological/Lymphatic/Immunilogical: No cervical lymphadenopathy. Cardiovascular: Normal rate, regular rhythm.  No murmurs, rubs, or gallops.  Respiratory: Normal respiratory effort without tachypnea nor retractions. Breath sounds are clear and equal bilaterally. No wheezes/rales/rhonchi. Gastrointestinal: Soft and non tender. No rebound. No guarding.  Genitourinary: Deferred Musculoskeletal: Normal range of motion in all extremities. No lower extremity edema. Neurologic:  Normal speech and language. Strength 5/5 in upper extremities. 4+/5 in bilateral lower extremities. Sensation grossly intact.  Skin:  Skin is warm, dry and intact. No rash noted. Psychiatric: Mood and affect are normal. Speech and behavior are normal. Patient exhibits appropriate insight and judgment.  ____________________________________________    LABS (pertinent positives/negatives)  CBC wbc 6.7, hgb 11.6, plt 302 BMP na 131, k 5.2, cr 1.28  ____________________________________________   EKG  None  ____________________________________________    RADIOLOGY  None  ____________________________________________   PROCEDURES  Procedures  ____________________________________________   INITIAL IMPRESSION / ASSESSMENT AND PLAN / ED COURSE  Pertinent labs & imaging results that were available during my care of the patient were reviewed by me and considered in my medical decision making (see chart for details).   Patient presented to the emergency department today because of concern for lower extremity weakness, difficulty with ambulation and numbness across her abdomen. Did have MRI done roughly 2 weeks ago which is concerning for metastatic disease of thoracic vertebra with some mass effect of the spinal cord. Discussed with Dr. Lacinda Axon with  neurosurgery who recommended steroid and transfer to [facility]. Discussed with patient who was comfortable with plan.  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 Any transcriptional errors that result from this process are unintentional     Nance Pear, MD 09/17/20 1914

## 2020-09-18 ENCOUNTER — Inpatient Hospital Stay: Payer: Medicare HMO | Admitting: Oncology

## 2020-09-18 DIAGNOSIS — C799 Secondary malignant neoplasm of unspecified site: Secondary | ICD-10-CM

## 2020-09-21 DIAGNOSIS — C7951 Secondary malignant neoplasm of bone: Secondary | ICD-10-CM | POA: Diagnosis not present

## 2020-09-21 DIAGNOSIS — C801 Malignant (primary) neoplasm, unspecified: Secondary | ICD-10-CM | POA: Diagnosis not present

## 2020-09-27 DIAGNOSIS — C7951 Secondary malignant neoplasm of bone: Secondary | ICD-10-CM | POA: Diagnosis not present

## 2020-09-27 DIAGNOSIS — R918 Other nonspecific abnormal finding of lung field: Secondary | ICD-10-CM | POA: Diagnosis not present

## 2020-09-27 DIAGNOSIS — E559 Vitamin D deficiency, unspecified: Secondary | ICD-10-CM | POA: Diagnosis not present

## 2020-09-27 DIAGNOSIS — N184 Chronic kidney disease, stage 4 (severe): Secondary | ICD-10-CM | POA: Diagnosis not present

## 2020-09-27 DIAGNOSIS — E871 Hypo-osmolality and hyponatremia: Secondary | ICD-10-CM | POA: Diagnosis not present

## 2020-09-27 DIAGNOSIS — D492 Neoplasm of unspecified behavior of bone, soft tissue, and skin: Secondary | ICD-10-CM | POA: Diagnosis not present

## 2020-09-27 DIAGNOSIS — E538 Deficiency of other specified B group vitamins: Secondary | ICD-10-CM | POA: Diagnosis not present

## 2020-09-27 DIAGNOSIS — Z483 Aftercare following surgery for neoplasm: Secondary | ICD-10-CM | POA: Diagnosis not present

## 2020-09-27 DIAGNOSIS — I129 Hypertensive chronic kidney disease with stage 1 through stage 4 chronic kidney disease, or unspecified chronic kidney disease: Secondary | ICD-10-CM | POA: Diagnosis not present

## 2020-09-27 DIAGNOSIS — D508 Other iron deficiency anemias: Secondary | ICD-10-CM | POA: Diagnosis not present

## 2020-10-01 DIAGNOSIS — D508 Other iron deficiency anemias: Secondary | ICD-10-CM | POA: Diagnosis not present

## 2020-10-01 DIAGNOSIS — E559 Vitamin D deficiency, unspecified: Secondary | ICD-10-CM | POA: Diagnosis not present

## 2020-10-01 DIAGNOSIS — I129 Hypertensive chronic kidney disease with stage 1 through stage 4 chronic kidney disease, or unspecified chronic kidney disease: Secondary | ICD-10-CM | POA: Diagnosis not present

## 2020-10-01 DIAGNOSIS — N184 Chronic kidney disease, stage 4 (severe): Secondary | ICD-10-CM | POA: Diagnosis not present

## 2020-10-01 DIAGNOSIS — E871 Hypo-osmolality and hyponatremia: Secondary | ICD-10-CM | POA: Diagnosis not present

## 2020-10-01 DIAGNOSIS — Z483 Aftercare following surgery for neoplasm: Secondary | ICD-10-CM | POA: Diagnosis not present

## 2020-10-01 DIAGNOSIS — R918 Other nonspecific abnormal finding of lung field: Secondary | ICD-10-CM | POA: Diagnosis not present

## 2020-10-01 DIAGNOSIS — C7951 Secondary malignant neoplasm of bone: Secondary | ICD-10-CM | POA: Diagnosis not present

## 2020-10-01 DIAGNOSIS — E538 Deficiency of other specified B group vitamins: Secondary | ICD-10-CM | POA: Diagnosis not present

## 2020-10-03 DIAGNOSIS — Z09 Encounter for follow-up examination after completed treatment for conditions other than malignant neoplasm: Secondary | ICD-10-CM | POA: Diagnosis not present

## 2020-10-03 DIAGNOSIS — C3492 Malignant neoplasm of unspecified part of left bronchus or lung: Secondary | ICD-10-CM | POA: Diagnosis not present

## 2020-10-03 DIAGNOSIS — C7951 Secondary malignant neoplasm of bone: Secondary | ICD-10-CM | POA: Diagnosis not present

## 2020-10-04 DIAGNOSIS — Z483 Aftercare following surgery for neoplasm: Secondary | ICD-10-CM | POA: Diagnosis not present

## 2020-10-04 DIAGNOSIS — E538 Deficiency of other specified B group vitamins: Secondary | ICD-10-CM | POA: Diagnosis not present

## 2020-10-04 DIAGNOSIS — N184 Chronic kidney disease, stage 4 (severe): Secondary | ICD-10-CM | POA: Diagnosis not present

## 2020-10-04 DIAGNOSIS — C7951 Secondary malignant neoplasm of bone: Secondary | ICD-10-CM | POA: Diagnosis not present

## 2020-10-04 DIAGNOSIS — E559 Vitamin D deficiency, unspecified: Secondary | ICD-10-CM | POA: Diagnosis not present

## 2020-10-04 DIAGNOSIS — I129 Hypertensive chronic kidney disease with stage 1 through stage 4 chronic kidney disease, or unspecified chronic kidney disease: Secondary | ICD-10-CM | POA: Diagnosis not present

## 2020-10-04 DIAGNOSIS — E871 Hypo-osmolality and hyponatremia: Secondary | ICD-10-CM | POA: Diagnosis not present

## 2020-10-04 DIAGNOSIS — D508 Other iron deficiency anemias: Secondary | ICD-10-CM | POA: Diagnosis not present

## 2020-10-04 DIAGNOSIS — R918 Other nonspecific abnormal finding of lung field: Secondary | ICD-10-CM | POA: Diagnosis not present

## 2020-10-05 DIAGNOSIS — E785 Hyperlipidemia, unspecified: Secondary | ICD-10-CM | POA: Diagnosis not present

## 2020-10-05 DIAGNOSIS — Z79899 Other long term (current) drug therapy: Secondary | ICD-10-CM | POA: Diagnosis not present

## 2020-10-05 DIAGNOSIS — N189 Chronic kidney disease, unspecified: Secondary | ICD-10-CM | POA: Diagnosis not present

## 2020-10-05 DIAGNOSIS — Z9889 Other specified postprocedural states: Secondary | ICD-10-CM | POA: Diagnosis not present

## 2020-10-05 DIAGNOSIS — I129 Hypertensive chronic kidney disease with stage 1 through stage 4 chronic kidney disease, or unspecified chronic kidney disease: Secondary | ICD-10-CM | POA: Diagnosis not present

## 2020-10-05 DIAGNOSIS — C7951 Secondary malignant neoplasm of bone: Secondary | ICD-10-CM | POA: Diagnosis not present

## 2020-10-05 DIAGNOSIS — R112 Nausea with vomiting, unspecified: Secondary | ICD-10-CM | POA: Diagnosis not present

## 2020-10-05 DIAGNOSIS — Z87891 Personal history of nicotine dependence: Secondary | ICD-10-CM | POA: Diagnosis not present

## 2020-10-09 DIAGNOSIS — E559 Vitamin D deficiency, unspecified: Secondary | ICD-10-CM | POA: Diagnosis not present

## 2020-10-09 DIAGNOSIS — E538 Deficiency of other specified B group vitamins: Secondary | ICD-10-CM | POA: Diagnosis not present

## 2020-10-09 DIAGNOSIS — N184 Chronic kidney disease, stage 4 (severe): Secondary | ICD-10-CM | POA: Diagnosis not present

## 2020-10-09 DIAGNOSIS — Z483 Aftercare following surgery for neoplasm: Secondary | ICD-10-CM | POA: Diagnosis not present

## 2020-10-09 DIAGNOSIS — I129 Hypertensive chronic kidney disease with stage 1 through stage 4 chronic kidney disease, or unspecified chronic kidney disease: Secondary | ICD-10-CM | POA: Diagnosis not present

## 2020-10-09 DIAGNOSIS — E871 Hypo-osmolality and hyponatremia: Secondary | ICD-10-CM | POA: Diagnosis not present

## 2020-10-09 DIAGNOSIS — R918 Other nonspecific abnormal finding of lung field: Secondary | ICD-10-CM | POA: Diagnosis not present

## 2020-10-09 DIAGNOSIS — D508 Other iron deficiency anemias: Secondary | ICD-10-CM | POA: Diagnosis not present

## 2020-10-09 DIAGNOSIS — C7951 Secondary malignant neoplasm of bone: Secondary | ICD-10-CM | POA: Diagnosis not present

## 2020-10-10 DIAGNOSIS — D492 Neoplasm of unspecified behavior of bone, soft tissue, and skin: Secondary | ICD-10-CM | POA: Diagnosis not present

## 2020-10-10 DIAGNOSIS — C3490 Malignant neoplasm of unspecified part of unspecified bronchus or lung: Secondary | ICD-10-CM | POA: Diagnosis not present

## 2020-10-10 DIAGNOSIS — C7951 Secondary malignant neoplasm of bone: Secondary | ICD-10-CM | POA: Diagnosis not present

## 2020-10-11 DIAGNOSIS — E871 Hypo-osmolality and hyponatremia: Secondary | ICD-10-CM | POA: Diagnosis not present

## 2020-10-11 DIAGNOSIS — N184 Chronic kidney disease, stage 4 (severe): Secondary | ICD-10-CM | POA: Diagnosis not present

## 2020-10-11 DIAGNOSIS — E538 Deficiency of other specified B group vitamins: Secondary | ICD-10-CM | POA: Diagnosis not present

## 2020-10-11 DIAGNOSIS — D508 Other iron deficiency anemias: Secondary | ICD-10-CM | POA: Diagnosis not present

## 2020-10-11 DIAGNOSIS — I129 Hypertensive chronic kidney disease with stage 1 through stage 4 chronic kidney disease, or unspecified chronic kidney disease: Secondary | ICD-10-CM | POA: Diagnosis not present

## 2020-10-11 DIAGNOSIS — Z483 Aftercare following surgery for neoplasm: Secondary | ICD-10-CM | POA: Diagnosis not present

## 2020-10-11 DIAGNOSIS — R918 Other nonspecific abnormal finding of lung field: Secondary | ICD-10-CM | POA: Diagnosis not present

## 2020-10-11 DIAGNOSIS — E559 Vitamin D deficiency, unspecified: Secondary | ICD-10-CM | POA: Diagnosis not present

## 2020-10-11 DIAGNOSIS — C7951 Secondary malignant neoplasm of bone: Secondary | ICD-10-CM | POA: Diagnosis not present

## 2020-10-15 DIAGNOSIS — Z483 Aftercare following surgery for neoplasm: Secondary | ICD-10-CM | POA: Diagnosis not present

## 2020-10-15 DIAGNOSIS — E871 Hypo-osmolality and hyponatremia: Secondary | ICD-10-CM | POA: Diagnosis not present

## 2020-10-15 DIAGNOSIS — I129 Hypertensive chronic kidney disease with stage 1 through stage 4 chronic kidney disease, or unspecified chronic kidney disease: Secondary | ICD-10-CM | POA: Diagnosis not present

## 2020-10-15 DIAGNOSIS — C7951 Secondary malignant neoplasm of bone: Secondary | ICD-10-CM | POA: Diagnosis not present

## 2020-10-15 DIAGNOSIS — R918 Other nonspecific abnormal finding of lung field: Secondary | ICD-10-CM | POA: Diagnosis not present

## 2020-10-15 DIAGNOSIS — E559 Vitamin D deficiency, unspecified: Secondary | ICD-10-CM | POA: Diagnosis not present

## 2020-10-15 DIAGNOSIS — N184 Chronic kidney disease, stage 4 (severe): Secondary | ICD-10-CM | POA: Diagnosis not present

## 2020-10-15 DIAGNOSIS — E538 Deficiency of other specified B group vitamins: Secondary | ICD-10-CM | POA: Diagnosis not present

## 2020-10-15 DIAGNOSIS — D508 Other iron deficiency anemias: Secondary | ICD-10-CM | POA: Diagnosis not present

## 2020-10-16 DIAGNOSIS — C7951 Secondary malignant neoplasm of bone: Secondary | ICD-10-CM | POA: Diagnosis not present

## 2020-10-17 DIAGNOSIS — Z510 Encounter for antineoplastic radiation therapy: Secondary | ICD-10-CM | POA: Diagnosis not present

## 2020-10-17 DIAGNOSIS — Z87891 Personal history of nicotine dependence: Secondary | ICD-10-CM | POA: Diagnosis not present

## 2020-10-17 DIAGNOSIS — R197 Diarrhea, unspecified: Secondary | ICD-10-CM | POA: Diagnosis not present

## 2020-10-17 DIAGNOSIS — C7951 Secondary malignant neoplasm of bone: Secondary | ICD-10-CM | POA: Diagnosis not present

## 2020-10-17 DIAGNOSIS — C3491 Malignant neoplasm of unspecified part of right bronchus or lung: Secondary | ICD-10-CM | POA: Diagnosis not present

## 2020-10-17 DIAGNOSIS — E871 Hypo-osmolality and hyponatremia: Secondary | ICD-10-CM | POA: Diagnosis not present

## 2020-10-17 DIAGNOSIS — M549 Dorsalgia, unspecified: Secondary | ICD-10-CM | POA: Diagnosis not present

## 2020-10-17 DIAGNOSIS — C3431 Malignant neoplasm of lower lobe, right bronchus or lung: Secondary | ICD-10-CM | POA: Diagnosis not present

## 2020-10-17 DIAGNOSIS — M62838 Other muscle spasm: Secondary | ICD-10-CM | POA: Diagnosis not present

## 2020-10-18 DIAGNOSIS — C7951 Secondary malignant neoplasm of bone: Secondary | ICD-10-CM | POA: Diagnosis not present

## 2020-10-19 DIAGNOSIS — G893 Neoplasm related pain (acute) (chronic): Secondary | ICD-10-CM | POA: Diagnosis present

## 2020-10-19 DIAGNOSIS — C3491 Malignant neoplasm of unspecified part of right bronchus or lung: Secondary | ICD-10-CM | POA: Diagnosis present

## 2020-10-19 DIAGNOSIS — C7951 Secondary malignant neoplasm of bone: Secondary | ICD-10-CM | POA: Diagnosis not present

## 2020-10-23 DIAGNOSIS — E871 Hypo-osmolality and hyponatremia: Secondary | ICD-10-CM | POA: Diagnosis not present

## 2020-10-23 DIAGNOSIS — I129 Hypertensive chronic kidney disease with stage 1 through stage 4 chronic kidney disease, or unspecified chronic kidney disease: Secondary | ICD-10-CM | POA: Diagnosis not present

## 2020-10-23 DIAGNOSIS — E559 Vitamin D deficiency, unspecified: Secondary | ICD-10-CM | POA: Diagnosis not present

## 2020-10-23 DIAGNOSIS — Z483 Aftercare following surgery for neoplasm: Secondary | ICD-10-CM | POA: Diagnosis not present

## 2020-10-23 DIAGNOSIS — D508 Other iron deficiency anemias: Secondary | ICD-10-CM | POA: Diagnosis not present

## 2020-10-23 DIAGNOSIS — C7951 Secondary malignant neoplasm of bone: Secondary | ICD-10-CM | POA: Diagnosis not present

## 2020-10-23 DIAGNOSIS — R918 Other nonspecific abnormal finding of lung field: Secondary | ICD-10-CM | POA: Diagnosis not present

## 2020-10-23 DIAGNOSIS — N184 Chronic kidney disease, stage 4 (severe): Secondary | ICD-10-CM | POA: Diagnosis not present

## 2020-10-23 DIAGNOSIS — E538 Deficiency of other specified B group vitamins: Secondary | ICD-10-CM | POA: Diagnosis not present

## 2020-10-30 DIAGNOSIS — E559 Vitamin D deficiency, unspecified: Secondary | ICD-10-CM | POA: Diagnosis not present

## 2020-10-30 DIAGNOSIS — Z483 Aftercare following surgery for neoplasm: Secondary | ICD-10-CM | POA: Diagnosis not present

## 2020-10-30 DIAGNOSIS — E871 Hypo-osmolality and hyponatremia: Secondary | ICD-10-CM | POA: Diagnosis not present

## 2020-10-30 DIAGNOSIS — C7951 Secondary malignant neoplasm of bone: Secondary | ICD-10-CM | POA: Diagnosis not present

## 2020-10-30 DIAGNOSIS — E538 Deficiency of other specified B group vitamins: Secondary | ICD-10-CM | POA: Diagnosis not present

## 2020-10-30 DIAGNOSIS — D508 Other iron deficiency anemias: Secondary | ICD-10-CM | POA: Diagnosis not present

## 2020-10-30 DIAGNOSIS — N184 Chronic kidney disease, stage 4 (severe): Secondary | ICD-10-CM | POA: Diagnosis not present

## 2020-10-30 DIAGNOSIS — I129 Hypertensive chronic kidney disease with stage 1 through stage 4 chronic kidney disease, or unspecified chronic kidney disease: Secondary | ICD-10-CM | POA: Diagnosis not present

## 2020-10-30 DIAGNOSIS — R918 Other nonspecific abnormal finding of lung field: Secondary | ICD-10-CM | POA: Diagnosis not present

## 2020-11-09 DIAGNOSIS — I129 Hypertensive chronic kidney disease with stage 1 through stage 4 chronic kidney disease, or unspecified chronic kidney disease: Secondary | ICD-10-CM | POA: Diagnosis not present

## 2020-11-09 DIAGNOSIS — C7951 Secondary malignant neoplasm of bone: Secondary | ICD-10-CM | POA: Diagnosis not present

## 2020-11-09 DIAGNOSIS — E538 Deficiency of other specified B group vitamins: Secondary | ICD-10-CM | POA: Diagnosis not present

## 2020-11-09 DIAGNOSIS — E871 Hypo-osmolality and hyponatremia: Secondary | ICD-10-CM | POA: Diagnosis not present

## 2020-11-09 DIAGNOSIS — E559 Vitamin D deficiency, unspecified: Secondary | ICD-10-CM | POA: Diagnosis not present

## 2020-11-09 DIAGNOSIS — R918 Other nonspecific abnormal finding of lung field: Secondary | ICD-10-CM | POA: Diagnosis not present

## 2020-11-09 DIAGNOSIS — D508 Other iron deficiency anemias: Secondary | ICD-10-CM | POA: Diagnosis not present

## 2020-11-09 DIAGNOSIS — N184 Chronic kidney disease, stage 4 (severe): Secondary | ICD-10-CM | POA: Diagnosis not present

## 2020-11-09 DIAGNOSIS — Z483 Aftercare following surgery for neoplasm: Secondary | ICD-10-CM | POA: Diagnosis not present

## 2020-11-13 DIAGNOSIS — Z483 Aftercare following surgery for neoplasm: Secondary | ICD-10-CM | POA: Diagnosis not present

## 2020-11-13 DIAGNOSIS — D508 Other iron deficiency anemias: Secondary | ICD-10-CM | POA: Diagnosis not present

## 2020-11-13 DIAGNOSIS — R918 Other nonspecific abnormal finding of lung field: Secondary | ICD-10-CM | POA: Diagnosis not present

## 2020-11-13 DIAGNOSIS — I129 Hypertensive chronic kidney disease with stage 1 through stage 4 chronic kidney disease, or unspecified chronic kidney disease: Secondary | ICD-10-CM | POA: Diagnosis not present

## 2020-11-13 DIAGNOSIS — E871 Hypo-osmolality and hyponatremia: Secondary | ICD-10-CM | POA: Diagnosis not present

## 2020-11-13 DIAGNOSIS — E538 Deficiency of other specified B group vitamins: Secondary | ICD-10-CM | POA: Diagnosis not present

## 2020-11-13 DIAGNOSIS — N184 Chronic kidney disease, stage 4 (severe): Secondary | ICD-10-CM | POA: Diagnosis not present

## 2020-11-13 DIAGNOSIS — C7951 Secondary malignant neoplasm of bone: Secondary | ICD-10-CM | POA: Diagnosis not present

## 2020-11-13 DIAGNOSIS — E559 Vitamin D deficiency, unspecified: Secondary | ICD-10-CM | POA: Diagnosis not present

## 2020-12-03 DIAGNOSIS — C7951 Secondary malignant neoplasm of bone: Secondary | ICD-10-CM | POA: Diagnosis not present

## 2020-12-03 DIAGNOSIS — R110 Nausea: Secondary | ICD-10-CM | POA: Diagnosis not present

## 2020-12-03 DIAGNOSIS — E871 Hypo-osmolality and hyponatremia: Secondary | ICD-10-CM | POA: Diagnosis not present

## 2020-12-03 DIAGNOSIS — C782 Secondary malignant neoplasm of pleura: Secondary | ICD-10-CM | POA: Diagnosis not present

## 2020-12-03 DIAGNOSIS — G9520 Unspecified cord compression: Secondary | ICD-10-CM | POA: Diagnosis not present

## 2020-12-03 DIAGNOSIS — C3491 Malignant neoplasm of unspecified part of right bronchus or lung: Secondary | ICD-10-CM | POA: Diagnosis not present

## 2020-12-11 ENCOUNTER — Telehealth: Payer: Self-pay | Admitting: Oncology

## 2020-12-11 NOTE — Telephone Encounter (Signed)
Patient called and stated that she has switched providers and is seeing an oncologist at [facility]. She has requested to cancel all upcoming appointments.   Routing to team to make aware.

## 2020-12-13 ENCOUNTER — Ambulatory Visit: Payer: Medicare HMO | Admitting: Oncology

## 2020-12-13 ENCOUNTER — Other Ambulatory Visit: Payer: Medicare HMO

## 2020-12-14 ENCOUNTER — Inpatient Hospital Stay: Payer: Medicare HMO

## 2020-12-14 ENCOUNTER — Inpatient Hospital Stay: Payer: Medicare HMO | Admitting: Oncology

## 2020-12-17 DIAGNOSIS — C782 Secondary malignant neoplasm of pleura: Secondary | ICD-10-CM | POA: Diagnosis not present

## 2020-12-17 DIAGNOSIS — C7951 Secondary malignant neoplasm of bone: Secondary | ICD-10-CM | POA: Diagnosis not present

## 2020-12-17 DIAGNOSIS — G9520 Unspecified cord compression: Secondary | ICD-10-CM | POA: Diagnosis not present

## 2020-12-17 DIAGNOSIS — J189 Pneumonia, unspecified organism: Secondary | ICD-10-CM | POA: Diagnosis not present

## 2020-12-17 DIAGNOSIS — C3491 Malignant neoplasm of unspecified part of right bronchus or lung: Secondary | ICD-10-CM | POA: Diagnosis not present

## 2020-12-21 DIAGNOSIS — C3491 Malignant neoplasm of unspecified part of right bronchus or lung: Secondary | ICD-10-CM | POA: Diagnosis not present

## 2020-12-21 DIAGNOSIS — N183 Chronic kidney disease, stage 3 unspecified: Secondary | ICD-10-CM | POA: Diagnosis not present

## 2020-12-21 DIAGNOSIS — E559 Vitamin D deficiency, unspecified: Secondary | ICD-10-CM | POA: Diagnosis not present

## 2020-12-21 DIAGNOSIS — D508 Other iron deficiency anemias: Secondary | ICD-10-CM | POA: Diagnosis not present

## 2020-12-21 DIAGNOSIS — C782 Secondary malignant neoplasm of pleura: Secondary | ICD-10-CM | POA: Diagnosis not present

## 2020-12-21 DIAGNOSIS — I129 Hypertensive chronic kidney disease with stage 1 through stage 4 chronic kidney disease, or unspecified chronic kidney disease: Secondary | ICD-10-CM | POA: Diagnosis not present

## 2020-12-21 DIAGNOSIS — Z0000 Encounter for general adult medical examination without abnormal findings: Secondary | ICD-10-CM | POA: Diagnosis not present

## 2020-12-28 DIAGNOSIS — M40204 Unspecified kyphosis, thoracic region: Secondary | ICD-10-CM | POA: Diagnosis not present

## 2020-12-28 DIAGNOSIS — C7951 Secondary malignant neoplasm of bone: Secondary | ICD-10-CM | POA: Diagnosis not present

## 2020-12-28 DIAGNOSIS — M4317 Spondylolisthesis, lumbosacral region: Secondary | ICD-10-CM | POA: Diagnosis not present

## 2020-12-28 DIAGNOSIS — Z981 Arthrodesis status: Secondary | ICD-10-CM | POA: Diagnosis not present

## 2020-12-28 DIAGNOSIS — C801 Malignant (primary) neoplasm, unspecified: Secondary | ICD-10-CM | POA: Diagnosis not present

## 2021-01-02 ENCOUNTER — Emergency Department: Payer: Medicare HMO

## 2021-01-02 ENCOUNTER — Other Ambulatory Visit: Payer: Self-pay

## 2021-01-02 ENCOUNTER — Inpatient Hospital Stay
Admission: EM | Admit: 2021-01-02 | Discharge: 2021-01-05 | DRG: 871 | Disposition: A | Discharge status: Home / Self Care | Payer: Medicare HMO | Attending: Hospitalist | Admitting: Hospitalist

## 2021-01-02 DIAGNOSIS — Z87891 Personal history of nicotine dependence: Secondary | ICD-10-CM

## 2021-01-02 DIAGNOSIS — C3490 Malignant neoplasm of unspecified part of unspecified bronchus or lung: Secondary | ICD-10-CM | POA: Diagnosis not present

## 2021-01-02 DIAGNOSIS — N183 Chronic kidney disease, stage 3 unspecified: Secondary | ICD-10-CM | POA: Diagnosis present

## 2021-01-02 DIAGNOSIS — J90 Pleural effusion, not elsewhere classified: Secondary | ICD-10-CM

## 2021-01-02 DIAGNOSIS — Z7951 Long term (current) use of inhaled steroids: Secondary | ICD-10-CM | POA: Diagnosis not present

## 2021-01-02 DIAGNOSIS — D630 Anemia in neoplastic disease: Secondary | ICD-10-CM | POA: Diagnosis present

## 2021-01-02 DIAGNOSIS — E876 Hypokalemia: Secondary | ICD-10-CM | POA: Diagnosis present

## 2021-01-02 DIAGNOSIS — I129 Hypertensive chronic kidney disease with stage 1 through stage 4 chronic kidney disease, or unspecified chronic kidney disease: Secondary | ICD-10-CM | POA: Diagnosis not present

## 2021-01-02 DIAGNOSIS — E440 Moderate protein-calorie malnutrition: Secondary | ICD-10-CM | POA: Diagnosis not present

## 2021-01-02 DIAGNOSIS — R0602 Shortness of breath: Secondary | ICD-10-CM | POA: Diagnosis not present

## 2021-01-02 DIAGNOSIS — J189 Pneumonia, unspecified organism: Secondary | ICD-10-CM | POA: Diagnosis not present

## 2021-01-02 DIAGNOSIS — R0902 Hypoxemia: Secondary | ICD-10-CM | POA: Diagnosis not present

## 2021-01-02 DIAGNOSIS — Z801 Family history of malignant neoplasm of trachea, bronchus and lung: Secondary | ICD-10-CM

## 2021-01-02 DIAGNOSIS — Z20822 Contact with and (suspected) exposure to covid-19: Secondary | ICD-10-CM | POA: Diagnosis not present

## 2021-01-02 DIAGNOSIS — D649 Anemia, unspecified: Secondary | ICD-10-CM | POA: Diagnosis not present

## 2021-01-02 DIAGNOSIS — A419 Sepsis, unspecified organism: Secondary | ICD-10-CM | POA: Diagnosis not present

## 2021-01-02 DIAGNOSIS — D72819 Decreased white blood cell count, unspecified: Secondary | ICD-10-CM | POA: Diagnosis not present

## 2021-01-02 DIAGNOSIS — J9601 Acute respiratory failure with hypoxia: Secondary | ICD-10-CM | POA: Diagnosis not present

## 2021-01-02 DIAGNOSIS — C3491 Malignant neoplasm of unspecified part of right bronchus or lung: Secondary | ICD-10-CM | POA: Diagnosis not present

## 2021-01-02 DIAGNOSIS — R531 Weakness: Secondary | ICD-10-CM | POA: Diagnosis not present

## 2021-01-02 DIAGNOSIS — Z6825 Body mass index (BMI) 25.0-25.9, adult: Secondary | ICD-10-CM | POA: Diagnosis not present

## 2021-01-02 DIAGNOSIS — Z79899 Other long term (current) drug therapy: Secondary | ICD-10-CM | POA: Diagnosis not present

## 2021-01-02 DIAGNOSIS — E785 Hyperlipidemia, unspecified: Secondary | ICD-10-CM | POA: Diagnosis present

## 2021-01-02 DIAGNOSIS — E871 Hypo-osmolality and hyponatremia: Secondary | ICD-10-CM | POA: Diagnosis present

## 2021-01-02 DIAGNOSIS — I959 Hypotension, unspecified: Secondary | ICD-10-CM | POA: Diagnosis not present

## 2021-01-02 LAB — COMPREHENSIVE METABOLIC PANEL
ALT: 17 U/L (ref 0–44)
AST: 44 U/L — ABNORMAL HIGH (ref 15–41)
Albumin: 2.4 g/dL — ABNORMAL LOW (ref 3.5–5.0)
Alkaline Phosphatase: 128 U/L — ABNORMAL HIGH (ref 38–126)
Anion gap: 7 (ref 5–15)
BUN: 21 mg/dL (ref 8–23)
CO2: 25 mmol/L (ref 22–32)
Calcium: 8.6 mg/dL — ABNORMAL LOW (ref 8.9–10.3)
Chloride: 95 mmol/L — ABNORMAL LOW (ref 98–111)
Creatinine, Ser: 0.91 mg/dL (ref 0.44–1.00)
GFR, Estimated: >60 mL/min (ref >60)
Glucose, Bld: 127 mg/dL — ABNORMAL HIGH (ref 70–99)
Potassium: 3.3 mmol/L — ABNORMAL LOW (ref 3.5–5.1)
Sodium: 127 mmol/L — ABNORMAL LOW (ref 135–145)
Total Bilirubin: 0.4 mg/dL (ref 0.3–1.2)
Total Protein: 6.3 g/dL — ABNORMAL LOW (ref 6.5–8.1)

## 2021-01-02 LAB — CBC WITH DIFFERENTIAL/PLATELET
Abs Immature Granulocytes: 0.02 10*3/uL (ref 0.00–0.07)
Basophils Absolute: 0 10*3/uL (ref 0.0–0.1)
Basophils Relative: 1 %
Eosinophils Absolute: 0 10*3/uL (ref 0.0–0.5)
Eosinophils Relative: 0 %
HCT: 30.6 % — ABNORMAL LOW (ref 36.0–46.0)
Hemoglobin: 10.1 g/dL — ABNORMAL LOW (ref 12.0–15.0)
Immature Granulocytes: 1 %
Lymphocytes Relative: 7 %
Lymphs Abs: 0.3 10*3/uL — ABNORMAL LOW (ref 0.7–4.0)
MCH: 29.3 pg (ref 26.0–34.0)
MCHC: 33 g/dL (ref 30.0–36.0)
MCV: 88.7 fL (ref 80.0–100.0)
Monocytes Absolute: 0.2 10*3/uL (ref 0.1–1.0)
Monocytes Relative: 5 %
Neutro Abs: 3.5 10*3/uL (ref 1.7–7.7)
Neutrophils Relative %: 86 %
Platelets: 240 10*3/uL (ref 150–400)
RBC: 3.45 MIL/uL — ABNORMAL LOW (ref 3.87–5.11)
RDW: 15 % (ref 11.5–15.5)
WBC: 4 10*3/uL (ref 4.0–10.5)
nRBC: 0 % (ref 0.0–0.2)

## 2021-01-02 LAB — RESP PANEL BY RT-PCR (FLU A&B, COVID) ARPGX2
Influenza A by PCR: NEGATIVE
Influenza B by PCR: NEGATIVE
SARS Coronavirus 2 by RT PCR: NEGATIVE

## 2021-01-02 LAB — TROPONIN I (HIGH SENSITIVITY): Troponin I (High Sensitivity): 68 ng/L — ABNORMAL HIGH (ref <18)

## 2021-01-02 LAB — LACTIC ACID, PLASMA
Lactic Acid, Venous: 0.9 mmol/L (ref 0.5–1.9)
Lactic Acid, Venous: 1.5 mmol/L (ref 0.5–1.9)

## 2021-01-02 LAB — BRAIN NATRIURETIC PEPTIDE: B Natriuretic Peptide: 55.5 pg/mL (ref 0.0–100.0)

## 2021-01-02 MED ORDER — ONDANSETRON HCL 4 MG/2ML IJ SOLN
4.0000 mg | Freq: Four times a day (QID) | INTRAMUSCULAR | Status: DC | PRN
  Administered 2021-01-04: 4 mg via INTRAVENOUS
  Filled 2021-01-02: qty 2

## 2021-01-02 MED ORDER — SODIUM CHLORIDE 0.9 % IV SOLN
500.0000 mg | Freq: Once | INTRAVENOUS | Status: AC
  Administered 2021-01-02: 500 mg via INTRAVENOUS
  Filled 2021-01-02: qty 500

## 2021-01-02 MED ORDER — ENOXAPARIN SODIUM 40 MG/0.4ML IJ SOSY
40.0000 mg | PREFILLED_SYRINGE | INTRAMUSCULAR | Status: DC
  Administered 2021-01-02 – 2021-01-04 (×3): 40 mg via SUBCUTANEOUS
  Filled 2021-01-02 (×3): qty 0.4

## 2021-01-02 MED ORDER — PROCHLORPERAZINE MALEATE 10 MG PO TABS
10.0000 mg | ORAL_TABLET | Freq: Four times a day (QID) | ORAL | Status: DC | PRN
  Filled 2021-01-02: qty 1

## 2021-01-02 MED ORDER — IPRATROPIUM-ALBUTEROL 0.5-2.5 (3) MG/3ML IN SOLN: 3.0000 mL | RESPIRATORY_TRACT | Status: DC | PRN

## 2021-01-02 MED ORDER — MAGNESIUM HYDROXIDE 400 MG/5ML PO SUSP: 30.0000 mL | Freq: Every day | ORAL | Status: DC | PRN

## 2021-01-02 MED ORDER — HYDROMORPHONE HCL 2 MG PO TABS
2.0000 mg | ORAL_TABLET | ORAL | Status: DC | PRN
Start: 2021-01-02 — End: 2021-01-05

## 2021-01-02 MED ORDER — PRAVASTATIN SODIUM 20 MG PO TABS
20.0000 mg | ORAL_TABLET | Freq: Every day | ORAL | Status: DC
  Administered 2021-01-02 – 2021-01-04 (×3): 20 mg via ORAL
  Filled 2021-01-02 (×3): qty 1

## 2021-01-02 MED ORDER — ONDANSETRON HCL 4 MG PO TABS: 4.0000 mg | ORAL_TABLET | Freq: Four times a day (QID) | ORAL | Status: DC | PRN

## 2021-01-02 MED ORDER — VITAMIN B-12 1000 MCG PO TABS
1000.0000 ug | ORAL_TABLET | Freq: Every day | ORAL | Status: DC
  Administered 2021-01-03 – 2021-01-05 (×3): 1000 ug via ORAL
  Filled 2021-01-02 (×3): qty 1

## 2021-01-02 MED ORDER — ACETAMINOPHEN 325 MG PO TABS: 650.0000 mg | ORAL_TABLET | Freq: Four times a day (QID) | ORAL | Status: DC | PRN

## 2021-01-02 MED ORDER — SODIUM CHLORIDE 0.9 % IV SOLN
2.0000 g | INTRAVENOUS | Status: DC
Start: 2021-01-03 — End: 2021-01-05
  Administered 2021-01-03 – 2021-01-04 (×2): 2 g via INTRAVENOUS
  Filled 2021-01-02: qty 2
  Filled 2021-01-02 (×2): qty 20

## 2021-01-02 MED ORDER — IOHEXOL 350 MG/ML SOLN
75.0000 mL | Freq: Once | INTRAVENOUS | Status: AC | PRN
  Administered 2021-01-02: 75 mL via INTRAVENOUS

## 2021-01-02 MED ORDER — DABRAFENIB MESYLATE 50 MG PO CAPS
100.0000 mg | ORAL_CAPSULE | Freq: Two times a day (BID) | ORAL | Status: DC
  Administered 2021-01-03 – 2021-01-05 (×4): 100 mg via ORAL
  Filled 2021-01-02 (×4): qty 2

## 2021-01-02 MED ORDER — TRAZODONE HCL 50 MG PO TABS: 25.0000 mg | ORAL_TABLET | Freq: Every evening | ORAL | Status: DC | PRN

## 2021-01-02 MED ORDER — SODIUM CHLORIDE 0.9 % IV BOLUS
1700.0000 mL | Freq: Once | INTRAVENOUS | Status: AC
  Administered 2021-01-02: 1700 mL via INTRAVENOUS

## 2021-01-02 MED ORDER — SODIUM CHLORIDE 0.9 % IV SOLN
2.0000 g | Freq: Once | INTRAVENOUS | Status: AC
  Administered 2021-01-02: 2 g via INTRAVENOUS
  Filled 2021-01-02: qty 2

## 2021-01-02 MED ORDER — HYDROCOD POLST-CPM POLST ER 10-8 MG/5ML PO SUER
5.0000 mL | Freq: Two times a day (BID) | ORAL | Status: DC | PRN
Start: 2021-01-02 — End: 2021-01-05

## 2021-01-02 MED ORDER — SODIUM CHLORIDE 0.9 % IV SOLN
500.0000 mg | INTRAVENOUS | Status: DC
  Administered 2021-01-03 – 2021-01-05 (×3): 500 mg via INTRAVENOUS
  Filled 2021-01-02 (×3): qty 500

## 2021-01-02 MED ORDER — VITAMIN D 25 MCG (1000 UNIT) PO TABS
1000.0000 [IU] | ORAL_TABLET | Freq: Every day | ORAL | Status: DC
  Administered 2021-01-03 – 2021-01-05 (×3): 1000 [IU] via ORAL
  Filled 2021-01-02 (×3): qty 1

## 2021-01-02 MED ORDER — IPRATROPIUM-ALBUTEROL 0.5-2.5 (3) MG/3ML IN SOLN
3.0000 mL | Freq: Four times a day (QID) | RESPIRATORY_TRACT | Status: DC
  Administered 2021-01-03 – 2021-01-05 (×7): 3 mL via RESPIRATORY_TRACT
  Filled 2021-01-02 (×9): qty 3

## 2021-01-02 MED ORDER — LOSARTAN POTASSIUM 50 MG PO TABS
50.0000 mg | ORAL_TABLET | Freq: Every day | ORAL | Status: DC
  Administered 2021-01-03 – 2021-01-05 (×3): 50 mg via ORAL
  Filled 2021-01-02 (×3): qty 1

## 2021-01-02 MED ORDER — ACETAMINOPHEN 650 MG RE SUPP: 650.0000 mg | Freq: Four times a day (QID) | RECTAL | Status: DC | PRN

## 2021-01-02 MED ORDER — GUAIFENESIN ER 600 MG PO TB12
600.0000 mg | ORAL_TABLET | Freq: Two times a day (BID) | ORAL | Status: DC
  Administered 2021-01-02 – 2021-01-05 (×6): 600 mg via ORAL
  Filled 2021-01-02 (×6): qty 1

## 2021-01-02 MED ORDER — POTASSIUM CHLORIDE IN NACL 20-0.9 MEQ/L-% IV SOLN
INTRAVENOUS | Status: DC
  Filled 2021-01-02 (×3): qty 1000

## 2021-01-02 MED ORDER — SODIUM CHLORIDE 1 G PO TABS
1.0000 g | ORAL_TABLET | Freq: Every day | ORAL | Status: DC | PRN
  Filled 2021-01-02: qty 1

## 2021-01-02 MED ORDER — TRAMETINIB DIMETHYL SULFOXIDE 0.5 MG PO TABS
1.5000 mg | ORAL_TABLET | Freq: Every day | ORAL | Status: DC
  Administered 2021-01-03 – 2021-01-04 (×2): 1.5 mg via ORAL
  Filled 2021-01-02 (×3): qty 3

## 2021-01-02 NOTE — H&P (Addendum)
[place]   PATIENT NAME: Tammy Shelton    MR#:  [id number]  DATE OF BIRTH:  [birth year]  DATE OF ADMISSION:  01/02/2021  PRIMARY CARE PHYSICIAN: Idelle Crouch, MD   Patient is coming from: Home  REQUESTING/REFERRING PHYSICIAN: Rada Hay, MD  CHIEF COMPLAINT:   Chief Complaint  Patient presents with  . Shortness of Breath    HISTORY OF PRESENT ILLNESS:  Tammy Shelton is a 70 y.o. 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  She admits to dyspnea on exertion over the last couple of days as well as productive cough of clear sputum.  She denies any wheezing.  No fever or chills.  No nausea or vomiting or abdominal pain.  No headache or dizziness or blurred vision.  She denied chest pain or palpitations.  No hemoptysis.  She denied any abdominal pain or melena or bright red bleeding per rectum.    ED Course: When she came to the ER blood pressure was 108/69 and later 86/62 with a respiratory rate of 14 and later 22.  Pulse ox 90 was 90% on room air and 94% and later 98% on 2 L O2 by nasal cannula.  Labs revealed hyponatremia 127 hypokalemia 95, hypokalemia of 3.3 and alk phos of 128 with albumin of 2.4 and total protein of 6.3 with AST of 44 ALT of 78.  BNP was 55.6 and high-sensitivity troponin I 68.  Lactic acid 0.9.  CBC showed anemia slightly worse than previous levels in May of this year.  Influenza antigens and COVID-19 PCR came back negative.  2 blood cultures were drawn. EKG as reviewed by me : EKG showed normal sinus rhythm with rate of 94 and borderline left axis deviation with low voltage QRS.  Imaging: Chest x-ray showed the following: Interval increase interstitial opacities, right greater than left, with associated right patchy airspace opacity. Slightly  increased in size, likely loculated, at least small to moderate volume right pleural effusion. Chest CT a showed the following: 1. Vascular cutoff of a segmental right lower lobe pulmonary artery likely due to obstruction from mass. Otherwise no definite pulmonary embolus. 2. Slightly improved aeration of the right middle and right upper lobes with however increased ground-glass consolidative opacities and interlobular septal thickening of the right upper lobe in a patient with known malignancy. Persistent trace to small volume right pleural effusion with associated nodularity. Limited evaluation of known parenchymal and pleural malignancy due to timing of contrast. Given appearance of right upper lobe, a superimposed infection can not be excluded. 3. Interval increase in axial and appendicular osseous metastases. Marked worsening of a expansile right lateral T9 rib metastatic lesion with underlying pathologic fracture not excluded.  The patient was given IV Rocephin and Zithromax as well as 30 mill per kilogram IV normal saline.  She will be admitted to a medical monitored bed for further evaluation and management. PAST MEDICAL HISTORY:   Past Medical History:  Diagnosis Date  . Hyperlipidemia   . Hypertension     PAST SURGICAL HISTORY:   Past Surgical History:  Procedure Laterality Date  . CHOLECYSTECTOMY      SOCIAL HISTORY:   Social History   Tobacco Use  . Smoking status: Former  . Smokeless tobacco: Never  Substance Use Topics  . Alcohol use: Yes  FAMILY HISTORY:   Family History  Problem Relation Age of Onset  . Breast cancer Neg Hx   Positive for lung cancer in her father.  Her mother died of old age.  DRUG ALLERGIES:  No Known Allergies  REVIEW OF SYSTEMS:   ROS As per history of present illness. All pertinent systems were reviewed above. Constitutional, HEENT, cardiovascular, respiratory, GI, GU, musculoskeletal, neuro, psychiatric, endocrine,  integumentary and hematologic systems were reviewed and are otherwise negative/unremarkable except for positive findings mentioned above in the HPI.   MEDICATIONS AT HOME:   Prior to Admission medications   Medication Sig Start Date End Date Taking? Authorizing Provider  Cholecalciferol 25 MCG (1000 UT) capsule Take 1,000 Units by mouth daily.   Yes [provider]  cyanocobalamin 1000 MCG tablet Take 1,000 mcg by mouth daily.   Yes [provider]  dabrafenib mesylate (TAFLINAR) 50 MG capsule Take 100 mg by mouth every 12 (twelve) hours. 10/25/20  Yes [provider]  HYDROmorphone (DILAUDID) 2 MG tablet Take 2 mg by mouth every 4 (four) hours as needed for pain. 12/03/20  Yes [provider]  losartan (COZAAR) 50 MG tablet Take 1 tablet by mouth daily. 02/09/19 01/02/21 Yes [provider]  lovastatin (MEVACOR) 20 MG tablet Take 1 tablet by mouth daily with supper. 02/09/19  Yes [provider]  sodium chloride 1 g tablet Take 1 g by mouth daily as needed. 12/03/20  Yes [provider]  traMADol (ULTRAM) 50 MG tablet Take 50 mg by mouth every 6 (six) hours as needed. 09/04/20  Yes [provider]  trametinib dimethyl sulfoxide (MEKINIST) 0.5 MG tablet Take 1.5 mg by mouth at bedtime. 10/25/20  Yes [provider]  amLODipine (NORVASC) 10 MG tablet Take 1 tablet by mouth daily. Patient not taking: No sig reported 02/09/19   [provider]  hydrochlorothiazide (HYDRODIURIL) 12.5 MG tablet Take 1 tablet by mouth daily. Patient not taking: No sig reported 06/29/19   [provider]  moxifloxacin (AVELOX) 400 MG tablet Take 400 mg by mouth daily. Patient not taking: No sig reported 12/17/20   [provider]  prochlorperazine (COMPAZINE) 10 MG tablet Take 10 mg by mouth every 6 (six) hours. 12/28/20   [provider]  umeclidinium-vilanterol (ANORO ELLIPTA) 62.5-25 MCG/INH AEPB Inhale 1 puff  into the lungs daily. Patient not taking: No sig reported 11/19/18   [provider]      VITAL SIGNS:  Blood pressure (!) 112/56, pulse 69, temperature 98.5 F (36.9 C), resp. rate (!) 35, height '4\' 11"'  (1.499 m), weight 56.7 kg, SpO2 96 %.  PHYSICAL EXAMINATION:  Physical Exam  GENERAL:  70 y.o.-year-old Caucasian female patient lying in the bed with no acute distress.  EYES: Pupils equal, round, reactive to light and accommodation. No scleral icterus. Extraocular muscles intact.  HEENT: Head atraumatic, normocephalic. Oropharynx and nasopharynx clear.  NECK:  Supple, no jugular venous distention. No thyroid enlargement, no tenderness.  LUNGS: Diminished bibasal breath sounds more on the right with bibasal crackles more on the right. CARDIOVASCULAR: Regular rate and rhythm, S1, S2 normal. No murmurs, rubs, or gallops.  ABDOMEN: Soft, nondistended, nontender. Bowel sounds present. No organomegaly or mass.  EXTREMITIES: No pedal edema, cyanosis, or clubbing.  NEUROLOGIC: Cranial nerves II through XII are intact. Muscle strength 5/5 in all extremities. Sensation intact. Gait not checked.  PSYCHIATRIC: The patient is alert and oriented x 3.  Normal affect and good eye contact. SKIN: No  obvious rash, lesion, or ulcer.   LABORATORY PANEL:   CBC Recent Labs  Lab 01/02/21 1551  WBC 4.0  HGB 10.1*  HCT 30.6*  PLT 240   ------------------------------------------------------------------------------------------------------------------  Chemistries  Recent Labs  Lab 01/02/21 1551  NA 127*  K 3.3*  CL 95*  CO2 25  GLUCOSE 127*  BUN 21  CREATININE 0.91  CALCIUM 8.6*  AST 44*  ALT 17  ALKPHOS 128*  BILITOT 0.4   ------------------------------------------------------------------------------------------------------------------  Cardiac Enzymes No results for input(s): TROPONINI in the last 168  hours. ------------------------------------------------------------------------------------------------------------------  RADIOLOGY:  CT Angio Chest PE W and/or Wo Contrast  Result Date: 01/02/2021 CLINICAL DATA:  Shortness of breath with exertion since yesterday. Hypotensive. History of lung cancer. EXAM: CT ANGIOGRAPHY CHEST WITH CONTRAST TECHNIQUE: Multidetector CT imaging of the chest was performed using the standard protocol during bolus administration of intravenous contrast. Multiplanar CT image reconstructions and MIPs were obtained to evaluate the vascular anatomy. CONTRAST:  83m OMNIPAQUE IOHEXOL 350 MG/ML SOLN COMPARISON:  CT chest 09/13/2020, MRI thoracic and lumbar spine 09/04/2020 FINDINGS: Cardiovascular: Satisfactory opacification of the pulmonary arteries to the segmental level. Vascular cutoff of a segmental right lower lobe pulmonary artery likely due to obstruction from mass. Otherwise no definite pulmonary embolus. The main pulmonary artery is normal in caliber. Normal heart size. No significant pericardial effusion. The thoracic aorta is normal in caliber. At least mild atherosclerotic plaque of the thoracic aorta. At least 2 vessel coronary artery calcifications. Mediastinum/Nodes: No definite enlarged mediastinal, hilar, or axillary lymph nodes. Thyroid gland, trachea, and esophagus demonstrate no significant findings. Lungs/Pleura: Slightly improved aeration of the right middle and right upper lobes with almost complete opacification of the right middle lobe and complete opacification of left lower lobe with air bronchograms. Interval worsening of interlobular septal wall thickening ground-glass consolidative opacity of the right upper lobe. Persistent trace to small volume right pleural effusion with associated nodularity. Limited evaluation of known lung mass and pleural lung masses due to timing of contrast. Redemonstration of endobronchial debris within the distal right middle  and right lower lobe bronchi. Upper Abdomen: Fluid density lesions partially visualized within the kidneys likely represent simple renal cysts. Punctate calcifications associated with spleen likely sequelae of prior granulomatous disease. No acute abnormality. Musculoskeletal: No chest wall abnormality. Status post T4 through T11 fusion and kyphoplasty. Diffusely decreased bone density. Interval increase in thoracic as well as appendicular vertebral body sclerotic densities. Slightly more conspicuous chronic posterior element fracture of the T9 vertebral body. Marked worsening of a expansile right lateral T9 rib metastatic lesion with underlying pathologic fracture not excluded (5:301) no acute displaced fracture. Multilevel severe degenerative changes of the spine. Chronic sternal as well as manubrial fracture. Review of the MIP images confirms the above findings. IMPRESSION: 1. Vascular cutoff of a segmental right lower lobe pulmonary artery likely due to obstruction from mass. Otherwise no definite pulmonary embolus. 2. Slightly improved aeration of the right middle and right upper lobes with however increased ground-glass consolidative opacities and interlobular septal thickening of the right upper lobe in a patient with known malignancy. Persistent trace to small volume right pleural effusion with associated nodularity. Limited evaluation of known parenchymal and pleural malignancy due to timing of contrast. Given appearance of right upper lobe, a superimposed infection can not be excluded. 3. Interval increase in axial and appendicular osseous metastases. Marked worsening of a expansile right lateral T9 rib metastatic lesion with underlying pathologic fracture not excluded. Electronically Signed   By: MThomasena Edis  Mckinley Jewel M.D.   On: 01/02/2021 18:43   DG Chest Portable 1 View  Result Date: 01/02/2021 CLINICAL DATA:  Patient with c/o SOB since yesterday with exertion. Pt was hypotensive, lung cancer EXAM:  PORTABLE CHEST 1 VIEW.  Patient is rotated. COMPARISON:  Chest x-ray 06/15/2019 FINDINGS: The heart and mediastinal contours are grossly unchanged given patient rotation. Interval increase interstitial opacities, right greater than left, with associated right patchy airspace opacity. Slightly increased in size, likely loculated, at least small to moderate volume right pleural effusion. No pneumothorax. No acute osseous abnormality. Status post thoracic spine surgical hardware. Marland Kitchen IMPRESSION: Interval increase interstitial opacities, right greater than left, with associated right patchy airspace opacity. Slightly increased in size, likely loculated, at least small to moderate volume right pleural effusion. Electronically Signed   By: Iven Finn M.D.   On: 01/02/2021 16:38      IMPRESSION AND PLAN:  Active Problems:   CAP (community acquired pneumonia)  1.  Community-acquired pneumonia likely postobstructive with associated stage IV right lung adenocarcinoma.  The patient has subsequent acute hypoxic respiratory failure.  He has subsequent mild sepsis as well as manifested by tachycardia with a heart rate of 95 shortly after he came to the ER as well as respiratory rate of 21 and later 25 . - The patient will be admitted to a medical monitored bed. - We will continue antibiotic therapy with IV Rocephin and Zithromax. - Mucolytic therapy will be provided. - The patient will be placed on as needed and scheduled DuoNebs. - We will follow sputum and blood cultures. - Oncology follow-up consult will be obtained. - O2 protocol will be followed. - We will hold off Anora Ellipta.  2.  Hyponatremia, acute on chronic. - The patient will be hydrated with IV normal saline. - We will hold off HCTZ. - We will follow BMP.  3.  Essential hypertension. - We will continue amlodipine and Cozaar and hold off HCTZ.  4.  Dyslipidemia. - We will continue statin therapy.  5.  Right lung adenocarcinoma. - We  will continue Mekinist.    DVT prophylaxis: Lovenox. Code Status: full code. Family Communication:  The plan of care was discussed in details with the patient (and family). I answered all questions. The patient agreed to proceed with the above mentioned plan. Further management will depend upon hospital course. Disposition Plan: Back to previous home environment Consults called: Oncology. All the records are reviewed and case discussed with ED provider.  Status is: Inpatient  Remains inpatient appropriate because:Ongoing diagnostic testing needed not appropriate for outpatient work up, Unsafe d/c plan, IV treatments appropriate due to intensity of illness or inability to take PO, and Inpatient level of care appropriate due to severity of illness  Dispo: The patient is from: Home              Anticipated d/c is to: Home              Patient currently is not medically stable to d/c.   Difficult to place patient No  TOTAL TIME TAKING CARE OF THIS PATIENT: 55 minutes.    Christel Mormon M.D on 01/02/2021 at 8:10 PM  [facility]   From 7 PM-7 AM, contact night-coverage [email or web address]  CC: Primary care physician; Idelle Crouch, MD

## 2021-01-02 NOTE — Plan of Care (Signed)

## 2021-01-02 NOTE — ED Provider Notes (Signed)
[facility]  ____________________________________________   Event Date/Time   First MD Initiated Contact with Patient 01/02/21 1549     (approximate)  I have reviewed the triage vital signs and the nursing notes.   HISTORY  Chief Complaint Shortness of Breath    HPI Tammy Shelton is a 70 y.o. female past medical history of adenocarcinoma of the right lung on chemotherapy, CKD, hyperlipidemia who presents with dyspnea and fatigue.  For the last 2 days patient has had increasing dyspnea on exertion.  She has also had cough productive of clear sputum.  She denies fevers or chills.  She has had decreased p.o. intake over the last several weeks and is feeling generally fatigued.  She denies lightheadedness or dizziness.  She denies chest pain.  No hemoptysis.  She denies any lower extremity swelling or pain.  She denies chest pain.         Past Medical History:  Diagnosis Date   Hyperlipidemia    Hypertension     Patient Active Problem List   Diagnosis Date Noted   Anemia 07/29/2019   Lung mass 04/23/2018   History of vitamin D deficiency 02/13/2017   Hyponatremia 01/12/2017   Pure hypercholesterolemia 11/29/2015   ANA positive 06/19/2015   Arthralgia of multiple joints 06/19/2015   CKD (chronic kidney disease) stage 3, GFR 30-59 ml/min ([place] [facility]) 06/19/2015   Former smoker 05/29/2015   Essential hypertension 08/23/2014    Past Surgical History:  Procedure Laterality Date   CHOLECYSTECTOMY      Prior to Admission medications   Medication Sig Start Date End Date Taking? Authorizing Provider  amLODipine (NORVASC) 10 MG tablet Take 1 tablet by mouth daily. 02/09/19   [provider]  hydrochlorothiazide (HYDRODIURIL) 12.5 MG tablet Take 1 tablet by mouth daily. 06/29/19   [provider]  losartan (COZAAR) 50 MG tablet Take 1 tablet by mouth daily. 02/09/19 09/12/20  [provider]  lovastatin (MEVACOR) 20 MG tablet Take 1  tablet by mouth daily with supper. 02/09/19   [provider]  umeclidinium-vilanterol (ANORO ELLIPTA) 62.5-25 MCG/INH AEPB Inhale 1 puff into the lungs daily. 11/19/18   [provider]    Allergies Patient has no known allergies.  Family History  Problem Relation Age of Onset   Breast cancer Neg Hx     Social History Social History   Tobacco Use   Smoking status: Former   Smokeless tobacco: Never  Scientific laboratory technician Use: Never used  Substance Use Topics   Alcohol use: Yes   Drug use: No    Review of Systems   Review of Systems  Constitutional:  Positive for activity change, appetite change and fatigue. Negative for chills and fever.  Respiratory:  Positive for cough and shortness of breath.   Cardiovascular:  Negative for chest pain.  Gastrointestinal:  Negative for abdominal pain, diarrhea, nausea and vomiting.  Genitourinary:  Negative for dysuria.  Neurological:  Negative for light-headedness.   Physical Exam Updated Vital Signs BP (!) 93/57   Pulse 72   Temp 98.5 F (36.9 C)   Resp (!) 21   Ht 4\' 11"  (1.499 m)   Wt 56.7 kg   SpO2 95%   BMI 25.25 kg/m   Physical Exam Vitals and nursing note reviewed.  Constitutional:      General: She is not in acute distress.    Appearance: Normal appearance.  HENT:     Head: Normocephalic and atraumatic.  Eyes:  General: No scleral icterus.    Conjunctiva/sclera: Conjunctivae normal.  Pulmonary:     Effort: Pulmonary effort is normal. No respiratory distress.     Breath sounds: No stridor.     Comments: Bilateral rhonchi right greater than left Musculoskeletal:        General: No deformity or signs of injury.     Cervical back: Normal range of motion.  Skin:    General: Skin is dry.     Coloration: Skin is not jaundiced or pale.  Neurological:     General: No focal deficit present.     Mental Status: She is alert and oriented to person, place, and time. Mental status is at baseline.   Psychiatric:        Mood and Affect: Mood normal.        Behavior: Behavior normal.     LABS (all labs ordered are listed, but only abnormal results are displayed)  Labs Reviewed  CBC WITH DIFFERENTIAL/PLATELET - Abnormal; Notable for the following components:      Result Value   RBC 3.45 (*)    Hemoglobin 10.1 (*)    HCT 30.6 (*)    Lymphs Abs 0.3 (*)    All other components within normal limits  COMPREHENSIVE METABOLIC PANEL - Abnormal; Notable for the following components:   Sodium 127 (*)    Potassium 3.3 (*)    Chloride 95 (*)    Glucose, Bld 127 (*)    Calcium 8.6 (*)    Total Protein 6.3 (*)    Albumin 2.4 (*)    AST 44 (*)    Alkaline Phosphatase 128 (*)    All other components within normal limits  TROPONIN I (HIGH SENSITIVITY) - Abnormal; Notable for the following components:   Troponin I (High Sensitivity) 68 (*)    All other components within normal limits  RESP PANEL BY RT-PCR (FLU A&B, COVID) ARPGX2  CULTURE, BLOOD (ROUTINE X 2)  CULTURE, BLOOD (ROUTINE X 2)  LACTIC ACID, PLASMA  LACTIC ACID, PLASMA   ____________________________________________  EKG  Normal sinus rhythm, normal axis, normal intervals, no acute ischemic changes ____________________________________________  RADIOLOGY I, Madelin Headings, personally viewed and evaluated these images (plain radiographs) as part of my medical decision making, as well as reviewing the written report by the radiologist.  ED MD interpretation: I reviewed the chest x-ray which shows a large right-sided pleural effusion, questionable mass similar to prior    ____________________________________________   PROCEDURES  Procedure(s) performed (including Critical Care):  Procedures   ____________________________________________   INITIAL IMPRESSION / ASSESSMENT AND PLAN / ED COURSE     Patient is a 70 year old female with history of adenocarcinoma of the lung on chemotherapy who presents with  worsening dyspnea.  She is saturating 90 to 92% on room air, was 88% with EMS.  Blood pressure 100s over 80s.  She is not in any respiratory distress.  She has had cough but otherwise no other infectious symptoms.  In general had decreased p.o. intake and some failure to thrive at home.  Her chest x-ray shows a right-sided pleural effusion, not significantly different from prior.  Her troponin is elevated at 68.  Given the increasing dyspnea and oxygen requirement in this patient with history of cancer will obtain CT angio to rule out PE, this will also give [place] more information about the lung mass.  Labs otherwise notable for hyponatremia to 127 and hypokalemia to 3.3.  CT angio was negative for PE however  there is cut off of the right segmental artery which is thought to be due to the lung mass.  There is some increased groundglass opacity and it is difficult to rule out infection.  Patient's blood pressure also trending down in the ED, 1 reading of 86/62.  We will give the patient 30 cc/kg obtain blood cultures and lactate.  Will cover with cefepime and azithromycin.  Will admit for ongoing management.  Clinical Course as of 01/02/21 1901  Wed Jan 02, 2021  1702 Troponin I (High Sensitivity)(!): 68 [KM]  1702 IMPRESSION: Interval increase interstitial opacities, right greater than left, with associated right patchy airspace opacity. Slightly increased in size, likely loculated, at least small to moderate volume right pleural effusion.   [KM]    Clinical Course User Index [KM] Rada Hay, MD     ____________________________________________   FINAL CLINICAL IMPRESSION(S) / ED DIAGNOSES  Final diagnoses:  Hypoxia     ED Discharge Orders     None        Note:  This document was prepared using Dragon voice recognition software and may include unintentional dictation errors.    Rada Hay, MD 01/02/21 1901

## 2021-01-02 NOTE — ED Triage Notes (Signed)
Pt comes with c/o SOB since yesterday with exertion. Pt was hypotensive with EMS. Fluids given BP improved.  22 left hand  Pt has hx of lung cancer.

## 2021-01-03 DIAGNOSIS — D72819 Decreased white blood cell count, unspecified: Secondary | ICD-10-CM

## 2021-01-03 DIAGNOSIS — C3491 Malignant neoplasm of unspecified part of right bronchus or lung: Secondary | ICD-10-CM

## 2021-01-03 DIAGNOSIS — D649 Anemia, unspecified: Secondary | ICD-10-CM

## 2021-01-03 DIAGNOSIS — Z79899 Other long term (current) drug therapy: Secondary | ICD-10-CM

## 2021-01-03 LAB — BASIC METABOLIC PANEL
Anion gap: 7 (ref 5–15)
BUN: 16 mg/dL (ref 8–23)
CO2: 20 mmol/L — ABNORMAL LOW (ref 22–32)
Calcium: 8.1 mg/dL — ABNORMAL LOW (ref 8.9–10.3)
Chloride: 104 mmol/L (ref 98–111)
Creatinine, Ser: 0.61 mg/dL (ref 0.44–1.00)
GFR, Estimated: >60 mL/min (ref >60)
Glucose, Bld: 74 mg/dL (ref 70–99)
Potassium: 3.3 mmol/L — ABNORMAL LOW (ref 3.5–5.1)
Sodium: 131 mmol/L — ABNORMAL LOW (ref 135–145)

## 2021-01-03 LAB — CBC
HCT: 27.5 % — ABNORMAL LOW (ref 36.0–46.0)
Hemoglobin: 8.9 g/dL — ABNORMAL LOW (ref 12.0–15.0)
MCH: 29.2 pg (ref 26.0–34.0)
MCHC: 32.4 g/dL (ref 30.0–36.0)
MCV: 90.2 fL (ref 80.0–100.0)
Platelets: 212 10*3/uL (ref 150–400)
RBC: 3.05 MIL/uL — ABNORMAL LOW (ref 3.87–5.11)
RDW: 15.1 % (ref 11.5–15.5)
WBC: 3.1 10*3/uL — ABNORMAL LOW (ref 4.0–10.5)
nRBC: 0 % (ref 0.0–0.2)

## 2021-01-03 LAB — TROPONIN I (HIGH SENSITIVITY): Troponin I (High Sensitivity): 23 ng/L — ABNORMAL HIGH (ref <18)

## 2021-01-03 LAB — HIV ANTIBODY (ROUTINE TESTING W REFLEX): HIV Screen 4th Generation wRfx: NONREACTIVE

## 2021-01-03 LAB — PROCALCITONIN: Procalcitonin: 1.11 ng/mL

## 2021-01-03 NOTE — Progress Notes (Signed)
Initial Nutrition Assessment  DOCUMENTATION CODES:  Non-severe (moderate) malnutrition in context of chronic illness  INTERVENTION:  Liberalize diet to regular for poor PO intake, weight loss, cancer dx, and advanced age Ensure Enlive po TID, each supplement provides 350 kcal and 20 grams of protein  NUTRITION DIAGNOSIS:  Moderate Malnutrition (in the context of chronic illness) related to cancer and cancer related treatments as evidenced by mild fat depletion, mild muscle depletion, moderate muscle depletion, percent weight loss (13.2% x 3 months).  GOAL:  Patient will meet greater than or equal to 90% of their needs  MONITOR:  PO intake, Supplement acceptance  REASON FOR ASSESSMENT:  Malnutrition Screening Tool    ASSESSMENT:  70 y.o. female with medical history of HTN, HLD, CKD3, vitamin d deficiency, and stage IV lung cancer on chemotherapy/immunomodulator therapy, presented to the ED with SOB and weakness.  Pt resting in bed at the time of assessment. States she has not been eating well and endorses weight loss and poor intake PTA. Reports that appetite has been sore since surgery in May. Reports that she continues to eat 3 meals a day at home, but portions are small. States that Oncologist encouraged her to eat snacks throughout the day and that she is consuming 3 ensures/d. Will add to nutrition plan here.   Pt hopeful that she will be able to go home today. Noted - 13.2% weight loss in the last 3 months (5/16-8/31)  Average Meal Intake: 9/2: 30% intake x 1 recorded meal  Nutritionally Relevant Medications: Scheduled Meds:  cholecalciferol  1,000 Units Oral Daily   dabrafenib mesylate  100 mg Oral Q12H   pravastatin  20 mg Oral QHS   trametinib dimethyl sulfoxide  1.5 mg Oral QHS   cyanocobalamin  1,000 mcg Oral Daily   Continuous Infusions:  0.9 % NaCl with KCl 20 mEq / L 100 mL/hr at 01/03/21 0747   azithromycin 500 mg (01/03/21 0927)   PRN Meds: magnesium  hydroxide, ondansetron, prochlorperazine  Labs Reviewed: Na 131 K 3.3 SBG ranges from 74-127 mg/dL over the last 24 hours  NUTRITION - FOCUSED PHYSICAL EXAM: Flowsheet Row Most Recent Value  Orbital Region Mild depletion  Upper Arm Region No depletion  Thoracic and Lumbar Region No depletion  Buccal Region Mild depletion  Temple Region Moderate depletion  Clavicle Bone Region Moderate depletion  Clavicle and Acromion Bone Region Moderate depletion  Scapular Bone Region Mild depletion  Dorsal Hand Mild depletion  Patellar Region Mild depletion  Anterior Thigh Region Mild depletion  Posterior Calf Region Mild depletion  Edema (RD Assessment) Mild  Hair Reviewed  Eyes Reviewed  Mouth Reviewed  Skin Reviewed  Nails Reviewed   Diet Order:   Diet Order             Diet regular Room service appropriate? Yes; Fluid consistency: Thin  Diet effective now                   EDUCATION NEEDS:  No education needs have been identified at this time  Skin:  Skin Assessment: Reviewed RN Assessment  Last BM:  9/1 - type 2  Height:  Ht Readings from Last 1 Encounters:  01/02/21 4\' 11"  (1.499 m)   Weight:  Wt Readings from Last 1 Encounters:  01/02/21 56.7 kg   Ideal Body Weight:  44.3 kg  BMI:  Body mass index is 25.25 kg/m.  Estimated Nutritional Needs:  Kcal:  1600-1800 kcal/d Protein:  70-80 g/d Fluid:  1.6-1.8L/d   Ranell Patrick, RD, LDN Clinical Dietitian Pager on [place]

## 2021-01-03 NOTE — Progress Notes (Signed)
PROGRESS NOTE    Tammy Shelton  NFA:[id number] DOB: 04-13-1951 DOA: 01/02/2021 PCP: Idelle Crouch, MD  [id number]/[id number]-AA   Assessment & Plan:   Active Problems:   CAP (community acquired pneumonia)   Tammy Shelton is a 70 y.o. 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  She admits to dyspnea on exertion over the last couple of days as well as productive cough of clear sputum.   Sepsis 2/2 pneumonia  CAP or postobstructive   --presented with tachycardia and tachypnea  --started on Rocephin and Zithromax on admission Plan: --cont ceftriaxone and azithromycin --DuoNeb scheduled  Acute hypoxic respiratory failure, ruled out --no documented O2 sats <90%.  stage IV right lung adenocarcinoma --follows with oncology in [facility] --cont [facility]  Hyponatremia, acute on chronic. --Na improved with IVF --Hold HCTZ  3.  Essential hypertension. - cont losartan --hold amlodipine and HCTZ  4.  Dyslipidemia. - We will continue statin therapy.  Hypokalemia --monitor and replete PRN   DVT prophylaxis: Lovenox SQ Code Status: Full code  Family Communication: daughter updated at bedside today Level of care: Med-Surg Dispo:   The patient is from: home Anticipated d/c is to: home Anticipated d/c date is: likely tomorrow Patient currently is not medically ready to d/c due to: on IV abx for PNA   Subjective and Interval History:  Pt reported feeling much better.  Wanted to go home already.  Reported having poor oral intake prior to presentation due to unpredictable nausea.   Objective: Vitals:   01/03/21 0819 01/03/21 1139 01/03/21 1635 01/03/21 1706  BP: 118/70   116/62  Pulse: 88   88  Resp: 16   16  Temp: 98.1 F (36.7 C)   98.5 F (36.9 C)  TempSrc:    Oral  SpO2: 93% 99% 98% 98%   Weight:      Height:        Intake/Output Summary (Last 24 hours) at 01/03/2021 1820 Last data filed at 01/03/2021 0747 Gross per 24 hour  Intake 1293.76 ml  Output 600 ml  Net 693.76 ml   Filed Weights   01/02/21 1549  Weight: 56.7 kg    Examination:   Constitutional: NAD, AAOx3 HEENT: conjunctivae and lids normal, EOMI CV: No cyanosis.   RESP: normal respiratory effort, clear, on RA Extremities: No effusions, edema in BLE SKIN: warm, dry Neuro: II - XII grossly intact.   Psych: Normal mood and affect.  Appropriate judgement and reason   Data Reviewed: I have personally reviewed following labs and imaging studies  CBC: Recent Labs  Lab 01/02/21 1551 01/03/21 0415  WBC 4.0 3.1*  NEUTROABS 3.5  --   HGB 10.1* 8.9*  HCT 30.6* 27.5*  MCV 88.7 90.2  PLT 240 469   Basic Metabolic Panel: Recent Labs  Lab 01/02/21 1551 01/03/21 0415  NA 127* 131*  K 3.3* 3.3*  CL 95* 104  CO2 25 20*  GLUCOSE 127* 74  BUN 21 16  CREATININE 0.91 0.61  CALCIUM 8.6* 8.1*   GFR: Estimated Creatinine Clearance: 50.2 mL/min (by C-G formula based on SCr of 0.61 mg/dL). Liver Function Tests: Recent Labs  Lab 01/02/21 1551  AST 44*  ALT 17  ALKPHOS 128*  BILITOT 0.4  PROT 6.3*  ALBUMIN 2.4*   No results for input(s): LIPASE, AMYLASE in the last 168 hours. No results for input(s):  AMMONIA in the last 168 hours. Coagulation Profile: No results for input(s): INR, PROTIME in the last 168 hours. Cardiac Enzymes: No results for input(s): CKTOTAL, CKMB, CKMBINDEX, TROPONINI in the last 168 hours. BNP (last 3 results) No results for input(s): PROBNP in the last 8760 hours. HbA1C: No results for input(s): HGBA1C in the last 72 hours. CBG: No results for input(s): GLUCAP in the last 168 hours. Lipid Profile: No results for input(s): CHOL, HDL, LDLCALC, TRIG, CHOLHDL, LDLDIRECT in the last 72 hours. Thyroid Function Tests: No results for input(s): TSH, T4TOTAL, FREET4, T3FREE,  THYROIDAB in the last 72 hours. Anemia Panel: No results for input(s): VITAMINB12, FOLATE, FERRITIN, TIBC, IRON, RETICCTPCT in the last 72 hours. Sepsis Labs: Recent Labs  Lab 01/02/21 1908 01/02/21 2202 01/03/21 1114  PROCALCITON  --   --  1.11  LATICACIDVEN 0.9 1.5  --     Recent Results (from the past 240 hour(s))  Resp Panel by RT-PCR (Flu A&B, Covid) Nasopharyngeal Swab     Status: None   Collection Time: 01/02/21  3:51 PM   Specimen: Nasopharyngeal Swab; Nasopharyngeal(NP) swabs in vial transport medium  Result Value Ref Range Status   SARS Coronavirus 2 by RT PCR NEGATIVE NEGATIVE Final    Comment: (NOTE) SARS-CoV-2 target nucleic acids are NOT DETECTED.  The SARS-CoV-2 RNA is generally detectable in upper respiratory specimens during the acute phase of infection. The lowest concentration of SARS-CoV-2 viral copies this assay can detect is 138 copies/mL. A negative result does not preclude SARS-Cov-2 infection and should not be used as the sole basis for treatment or other patient management decisions. A negative result may occur with  improper specimen collection/handling, submission of specimen other than nasopharyngeal swab, presence of viral mutation(s) within the areas targeted by this assay, and inadequate number of viral copies(<138 copies/mL). A negative result must be combined with clinical observations, patient history, and epidemiological information. The expected result is Negative.  Fact Sheet for Patients:  EntrepreneurPulse.com.au  Fact Sheet for Healthcare Providers:  IncredibleEmployment.be  This test is no t yet approved or cleared by the [place] FDA and  has been authorized for detection and/or diagnosis of SARS-CoV-2 by FDA under an Emergency Use Authorization (EUA). This EUA will remain  in effect (meaning this test can be used) for the duration of the COVID-19 declaration under Section 564(b)(1) of the  Act, 21 U.S.C.section 360bbb-3(b)(1), unless the authorization is terminated  or revoked sooner.       Influenza A by PCR NEGATIVE NEGATIVE Final   Influenza B by PCR NEGATIVE NEGATIVE Final    Comment: (NOTE) The Xpert Xpress SARS-CoV-2/FLU/RSV plus assay is intended as an aid in the diagnosis of influenza from Nasopharyngeal swab specimens and should not be used as a sole basis for treatment. Nasal washings and aspirates are unacceptable for Xpert Xpress SARS-CoV-2/FLU/RSV testing.  Fact Sheet for Patients: EntrepreneurPulse.com.au  Fact Sheet for Healthcare Providers: IncredibleEmployment.be  This test is not yet approved or cleared by the [place] FDA and has been authorized for detection and/or diagnosis of SARS-CoV-2 by FDA under an Emergency Use Authorization (EUA). This EUA will remain in effect (meaning this test can be used) for the duration of the COVID-19 declaration under Section 564(b)(1) of the Act, 21 U.S.C. section 360bbb-3(b)(1), unless the authorization is terminated or revoked.  Performed at [facility], [address] [place], [place] [address]   Blood culture (routine x 2)     Status: None (Preliminary result)  Collection Time: 01/02/21  7:08 PM   Specimen: BLOOD  Result Value Ref Range Status   Specimen Description BLOOD RIGHT ANTECUBITAL  Final   Special Requests   Final    BOTTLES DRAWN AEROBIC AND ANAEROBIC Blood Culture adequate volume   Culture   Final    NO GROWTH < 12 HOURS Performed at [facility], [address] [place], [place] [address]    Report Status PENDING  Incomplete  Blood culture (routine x 2)     Status: None (Preliminary result)   Collection Time: 01/02/21  7:38 PM   Specimen: BLOOD  Result Value Ref Range Status   Specimen Description BLOOD RIGHT ANTECUBITAL  Final   Special Requests   Final    BOTTLES DRAWN AEROBIC AND ANAEROBIC Blood Culture adequate volume    Culture   Final    NO GROWTH < 12 HOURS Performed at [facility], [address], [place], [place] [address]    Report Status PENDING  Incomplete      Radiology Studies: CT Angio Chest PE W and/or Wo Contrast  Result Date: 01/02/2021 CLINICAL DATA:  Shortness of breath with exertion since yesterday. Hypotensive. History of lung cancer. EXAM: CT ANGIOGRAPHY CHEST WITH CONTRAST TECHNIQUE: Multidetector CT imaging of the chest was performed using the standard protocol during bolus administration of intravenous contrast. Multiplanar CT image reconstructions and MIPs were obtained to evaluate the vascular anatomy. CONTRAST:  31mL OMNIPAQUE IOHEXOL 350 MG/ML SOLN COMPARISON:  CT chest 09/13/2020, MRI thoracic and lumbar spine 09/04/2020 FINDINGS: Cardiovascular: Satisfactory opacification of the pulmonary arteries to the segmental level. Vascular cutoff of a segmental right lower lobe pulmonary artery likely due to obstruction from mass. Otherwise no definite pulmonary embolus. The main pulmonary artery is normal in caliber. Normal heart size. No significant pericardial effusion. The thoracic aorta is normal in caliber. At least mild atherosclerotic plaque of the thoracic aorta. At least 2 vessel coronary artery calcifications. Mediastinum/Nodes: No definite enlarged mediastinal, hilar, or axillary lymph nodes. Thyroid gland, trachea, and esophagus demonstrate no significant findings. Lungs/Pleura: Slightly improved aeration of the right middle and right upper lobes with almost complete opacification of the right middle lobe and complete opacification of left lower lobe with air bronchograms. Interval worsening of interlobular septal wall thickening ground-glass consolidative opacity of the right upper lobe. Persistent trace to small volume right pleural effusion with associated nodularity. Limited evaluation of known lung mass and pleural lung masses due to timing of contrast. Redemonstration of  endobronchial debris within the distal right middle and right lower lobe bronchi. Upper Abdomen: Fluid density lesions partially visualized within the kidneys likely represent simple renal cysts. Punctate calcifications associated with spleen likely sequelae of prior granulomatous disease. No acute abnormality. Musculoskeletal: No chest wall abnormality. Status post T4 through T11 fusion and kyphoplasty. Diffusely decreased bone density. Interval increase in thoracic as well as appendicular vertebral body sclerotic densities. Slightly more conspicuous chronic posterior element fracture of the T9 vertebral body. Marked worsening of a expansile right lateral T9 rib metastatic lesion with underlying pathologic fracture not excluded (5:301) no acute displaced fracture. Multilevel severe degenerative changes of the spine. Chronic sternal as well as manubrial fracture. Review of the MIP images confirms the above findings. IMPRESSION: 1. Vascular cutoff of a segmental right lower lobe pulmonary artery likely due to obstruction from mass. Otherwise no definite pulmonary embolus. 2. Slightly improved aeration of the right middle and right upper lobes with however increased ground-glass consolidative opacities and interlobular septal  thickening of the right upper lobe in a patient with known malignancy. Persistent trace to small volume right pleural effusion with associated nodularity. Limited evaluation of known parenchymal and pleural malignancy due to timing of contrast. Given appearance of right upper lobe, a superimposed infection can not be excluded. 3. Interval increase in axial and appendicular osseous metastases. Marked worsening of a expansile right lateral T9 rib metastatic lesion with underlying pathologic fracture not excluded. Electronically Signed   By: Iven Finn M.D.   On: 01/02/2021 18:43   DG Chest Portable 1 View  Result Date: 01/02/2021 CLINICAL DATA:  Patient with c/o SOB since yesterday with  exertion. Pt was hypotensive, lung cancer EXAM: PORTABLE CHEST 1 VIEW.  Patient is rotated. COMPARISON:  Chest x-ray 06/15/2019 FINDINGS: The heart and mediastinal contours are grossly unchanged given patient rotation. Interval increase interstitial opacities, right greater than left, with associated right patchy airspace opacity. Slightly increased in size, likely loculated, at least small to moderate volume right pleural effusion. No pneumothorax. No acute osseous abnormality. Status post thoracic spine surgical hardware. Marland Kitchen IMPRESSION: Interval increase interstitial opacities, right greater than left, with associated right patchy airspace opacity. Slightly increased in size, likely loculated, at least small to moderate volume right pleural effusion. Electronically Signed   By: Iven Finn M.D.   On: 01/02/2021 16:38     Scheduled Meds:  cholecalciferol  1,000 Units Oral Daily   dabrafenib mesylate  100 mg Oral Q12H   enoxaparin (LOVENOX) injection  40 mg Subcutaneous Q24H   guaiFENesin  600 mg Oral BID   ipratropium-albuterol  3 mL Nebulization QID   losartan  50 mg Oral Daily   pravastatin  20 mg Oral QHS   trametinib dimethyl sulfoxide  1.5 mg Oral QHS   cyanocobalamin  1,000 mcg Oral Daily   Continuous Infusions:  azithromycin Stopped (01/03/21 1752)   cefTRIAXone (ROCEPHIN)  IV       LOS: 1 day     Enzo Bi, MD [facility] If 7PM-7AM, please contact night-coverage 01/03/2021, 6:20 PM

## 2021-01-03 NOTE — Consult Note (Signed)
[place]  [phone]) [phone] Fax:(336) [phone]  ID: Caffie Pinto OB: [birth year]  MR#: [id number]  XTG#:[id number]  Patient Care Team: Idelle Crouch, MD as PCP - General (Internal Medicine)  CHIEF COMPLAINT: Stage IV lung cancer admitted with community-acquired pneumonia.  INTERVAL HISTORY: Patient is a 70 year old female who is actively receiving treatment at [facility] for stage IV adenocarcinoma of the right lung.  She recently presented to the emergency room with increasing dyspnea on exertion and fatigue.  Subsequent work-up revealed community-acquired pneumonia.  Currently, she feels well and significantly improved from admission.  She has no neurologic complaints.  Her fatigue has improved.  She does not complain of any further dyspnea.  She has no chest pain, cough, or hemoptysis.  She denies any nausea, vomiting, constipation, or diarrhea.  She has no urinary complaints.  Patient offers no further specific complaints today.  REVIEW OF SYSTEMS:   Review of Systems  Constitutional:  Positive for malaise/fatigue. Negative for fever and weight loss.  Respiratory:  Positive for shortness of breath. Negative for cough and hemoptysis.   Cardiovascular: Negative.  Negative for chest pain and leg swelling.  Gastrointestinal: Negative.  Negative for abdominal pain.  Genitourinary: Negative.  Negative for dysuria.  Musculoskeletal: Negative.  Negative for back pain.  Skin: Negative.  Negative for rash.  Neurological:  Positive for weakness. Negative for dizziness, focal weakness and headaches.  Psychiatric/Behavioral: Negative.  The patient is not nervous/anxious.    As per HPI. Otherwise, a complete review of systems is negative.  PAST MEDICAL HISTORY: Past Medical History:  Diagnosis Date   Hyperlipidemia    Hypertension     PAST SURGICAL HISTORY: Past Surgical History:  Procedure Laterality Date   CHOLECYSTECTOMY      FAMILY  HISTORY: Family History  Problem Relation Age of Onset   Breast cancer Neg Hx     ADVANCED DIRECTIVES (Y/N):  @ADVDIR @  HEALTH MAINTENANCE: Social History   Tobacco Use   Smoking status: Former   Smokeless tobacco: Never  Scientific laboratory technician Use: Never used  Substance Use Topics   Alcohol use: Yes   Drug use: No     Colonoscopy:  PAP:  Bone density:  Lipid panel:  No Known Allergies  Current Facility-Administered Medications  Medication Dose Route Frequency Provider Last Rate Last Admin   acetaminophen (TYLENOL) tablet 650 mg  650 mg Oral Q6H PRN Mansy, Jan A, MD       Or   acetaminophen (TYLENOL) suppository 650 mg  650 mg Rectal Q6H PRN Mansy, Jan A, MD       azithromycin (ZITHROMAX) 500 mg in sodium chloride 0.9 % 250 mL IVPB  500 mg Intravenous Q24H Mansy, Jan A, MD   Stopped at 01/03/21 1752   cefTRIAXone (ROCEPHIN) 2 g in sodium chloride 0.9 % 100 mL IVPB  2 g Intravenous Q24H Mansy, Jan A, MD 200 mL/hr at 01/03/21 2146 2 g at 01/03/21 2146   chlorpheniramine-HYDROcodone (TUSSIONEX) 10-8 MG/5ML suspension 5 mL  5 mL Oral Q12H PRN Mansy, Jan A, MD       cholecalciferol (VITAMIN D3) tablet 1,000 Units  1,000 Units Oral Daily Mansy, Jan A, MD   1,000 Units at 01/03/21 0928   dabrafenib mesylate (TAFLINAR) capsule 100 mg  100 mg Oral Q12H Mansy, Jan A, MD       enoxaparin (LOVENOX) injection 40 mg  40 mg Subcutaneous Q24H Mansy, Jan A, MD   40 mg at 01/03/21  2137   guaiFENesin (MUCINEX) 12 hr tablet 600 mg  600 mg Oral BID Mansy, Jan A, MD   600 mg at 01/03/21 2137   HYDROmorphone (DILAUDID) tablet 2 mg  2 mg Oral Q4H PRN Mansy, Jan A, MD       ipratropium-albuterol (DUONEB) 0.5-2.5 (3) MG/3ML nebulizer solution 3 mL  3 mL Nebulization QID Mansy, Jan A, MD   3 mL at 01/03/21 1633   ipratropium-albuterol (DUONEB) 0.5-2.5 (3) MG/3ML nebulizer solution 3 mL  3 mL Nebulization Q4H PRN Mansy, Jan A, MD       losartan (COZAAR) tablet 50 mg  50 mg Oral Daily Mansy, Jan A, MD    50 mg at 01/03/21 7510   magnesium hydroxide (MILK OF MAGNESIA) suspension 30 mL  30 mL Oral Daily PRN Mansy, Jan A, MD       ondansetron [facility]) tablet 4 mg  4 mg Oral Q6H PRN Mansy, Jan A, MD       Or   ondansetron [facility]) injection 4 mg  4 mg Intravenous Q6H PRN Mansy, Jan A, MD       pravastatin (PRAVACHOL) tablet 20 mg  20 mg Oral QHS Mansy, Jan A, MD   20 mg at 01/03/21 2137   prochlorperazine (COMPAZINE) tablet 10 mg  10 mg Oral Q6H PRN Mansy, Jan A, MD       sodium chloride tablet 1 g  1 g Oral Daily PRN Mansy, Jan A, MD       trametinib dimethyl sulfoxide (MEKINIST) tablet 1.5 mg  1.5 mg Oral QHS Mansy, Jan A, MD       traZODone (DESYREL) tablet 25 mg  25 mg Oral QHS PRN Mansy, Jan A, MD       vitamin B-12 (CYANOCOBALAMIN) tablet 1,000 mcg  1,000 mcg Oral Daily Mansy, Jan A, MD   1,000 mcg at 01/03/21 0928    OBJECTIVE: Vitals:   01/03/21 1706 01/03/21 1953  BP: 116/62 135/69  Pulse: 88 96  Resp: 16 18  Temp: 98.5 F (36.9 C) 98.3 F (36.8 C)  SpO2: 98% 98%     Body mass index is 25.25 kg/m.    ECOG FS:2 - Symptomatic, <50% confined to bed  General: Well-developed, well-nourished, no acute distress. Eyes: Pink conjunctiva, anicteric sclera. HEENT: Normocephalic, moist mucous membranes. Lungs: No audible wheezing or coughing. Heart: Regular rate and rhythm. Abdomen: Soft, nontender, no obvious distention. Musculoskeletal: No edema, cyanosis, or clubbing. Neuro: Alert, answering all questions appropriately. Cranial nerves grossly intact. Skin: No rashes or petechiae noted. Psych: Normal affect.   LAB RESULTS:  Lab Results  Component Value Date   NA 131 (L) 01/03/2021   K 3.3 (L) 01/03/2021   CL 104 01/03/2021   CO2 20 (L) 01/03/2021   GLUCOSE 74 01/03/2021   BUN 16 01/03/2021   CREATININE 0.61 01/03/2021   CALCIUM 8.1 (L) 01/03/2021   PROT 6.3 (L) 01/02/2021   ALBUMIN 2.4 (L) 01/02/2021   AST 44 (H) 01/02/2021   ALT 17 01/02/2021   ALKPHOS 128 (H)  01/02/2021   BILITOT 0.4 01/02/2021   GFRNONAA >60 01/03/2021   GFRAA >60 09/14/2014    Lab Results  Component Value Date   WBC 3.1 (L) 01/03/2021   NEUTROABS 3.5 01/02/2021   HGB 8.9 (L) 01/03/2021   HCT 27.5 (L) 01/03/2021   MCV 90.2 01/03/2021   PLT 212 01/03/2021     STUDIES: CT Angio Chest PE W and/or Wo Contrast  Result Date: 01/02/2021  CLINICAL DATA:  Shortness of breath with exertion since yesterday. Hypotensive. History of lung cancer. EXAM: CT ANGIOGRAPHY CHEST WITH CONTRAST TECHNIQUE: Multidetector CT imaging of the chest was performed using the standard protocol during bolus administration of intravenous contrast. Multiplanar CT image reconstructions and MIPs were obtained to evaluate the vascular anatomy. CONTRAST:  60mL OMNIPAQUE IOHEXOL 350 MG/ML SOLN COMPARISON:  CT chest 09/13/2020, MRI thoracic and lumbar spine 09/04/2020 FINDINGS: Cardiovascular: Satisfactory opacification of the pulmonary arteries to the segmental level. Vascular cutoff of a segmental right lower lobe pulmonary artery likely due to obstruction from mass. Otherwise no definite pulmonary embolus. The main pulmonary artery is normal in caliber. Normal heart size. No significant pericardial effusion. The thoracic aorta is normal in caliber. At least mild atherosclerotic plaque of the thoracic aorta. At least 2 vessel coronary artery calcifications. Mediastinum/Nodes: No definite enlarged mediastinal, hilar, or axillary lymph nodes. Thyroid gland, trachea, and esophagus demonstrate no significant findings. Lungs/Pleura: Slightly improved aeration of the right middle and right upper lobes with almost complete opacification of the right middle lobe and complete opacification of left lower lobe with air bronchograms. Interval worsening of interlobular septal wall thickening ground-glass consolidative opacity of the right upper lobe. Persistent trace to small volume right pleural effusion with associated nodularity.  Limited evaluation of known lung mass and pleural lung masses due to timing of contrast. Redemonstration of endobronchial debris within the distal right middle and right lower lobe bronchi. Upper Abdomen: Fluid density lesions partially visualized within the kidneys likely represent simple renal cysts. Punctate calcifications associated with spleen likely sequelae of prior granulomatous disease. No acute abnormality. Musculoskeletal: No chest wall abnormality. Status post T4 through T11 fusion and kyphoplasty. Diffusely decreased bone density. Interval increase in thoracic as well as appendicular vertebral body sclerotic densities. Slightly more conspicuous chronic posterior element fracture of the T9 vertebral body. Marked worsening of a expansile right lateral T9 rib metastatic lesion with underlying pathologic fracture not excluded (5:301) no acute displaced fracture. Multilevel severe degenerative changes of the spine. Chronic sternal as well as manubrial fracture. Review of the MIP images confirms the above findings. IMPRESSION: 1. Vascular cutoff of a segmental right lower lobe pulmonary artery likely due to obstruction from mass. Otherwise no definite pulmonary embolus. 2. Slightly improved aeration of the right middle and right upper lobes with however increased ground-glass consolidative opacities and interlobular septal thickening of the right upper lobe in a patient with known malignancy. Persistent trace to small volume right pleural effusion with associated nodularity. Limited evaluation of known parenchymal and pleural malignancy due to timing of contrast. Given appearance of right upper lobe, a superimposed infection can not be excluded. 3. Interval increase in axial and appendicular osseous metastases. Marked worsening of a expansile right lateral T9 rib metastatic lesion with underlying pathologic fracture not excluded. Electronically Signed   By: Iven Finn M.D.   On: 01/02/2021 18:43   DG  Chest Portable 1 View  Result Date: 01/02/2021 CLINICAL DATA:  Patient with c/o SOB since yesterday with exertion. Pt was hypotensive, lung cancer EXAM: PORTABLE CHEST 1 VIEW.  Patient is rotated. COMPARISON:  Chest x-ray 06/15/2019 FINDINGS: The heart and mediastinal contours are grossly unchanged given patient rotation. Interval increase interstitial opacities, right greater than left, with associated right patchy airspace opacity. Slightly increased in size, likely loculated, at least small to moderate volume right pleural effusion. No pneumothorax. No acute osseous abnormality. Status post thoracic spine surgical hardware. Marland Kitchen IMPRESSION: Interval increase interstitial opacities, right greater  than left, with associated right patchy airspace opacity. Slightly increased in size, likely loculated, at least small to moderate volume right pleural effusion. Electronically Signed   By: Iven Finn M.D.   On: 01/02/2021 16:38    ASSESSMENT: Stage IV lung cancer admitted with community-acquired pneumonia.  PLAN:    Stage IV lung cancer: Patient receives all of her care at [facility] and is actively receiving dose reduced dabrafenib and trametinib.  Okay to continue treatment as ordered.  No further intervention is needed.  Patient reports she has follow-up at [facility] next week for further evaluation and has been instructed to keep this appointment as scheduled. Community-acquired noted: Improving.  Continue current antibiotics as ordered.   Leukopenia: Mild, likely secondary to treatments.  Monitor.   Anemia: Mild.  Patient's hemoglobin is 8.9.  No intervention needed.    Appreciate consult, call with questions.  Lloyd Huger, MD   01/03/2021 10:26 PM

## 2021-01-04 DIAGNOSIS — E440 Moderate protein-calorie malnutrition: Secondary | ICD-10-CM | POA: Insufficient documentation

## 2021-01-04 LAB — BASIC METABOLIC PANEL
Anion gap: 8 (ref 5–15)
BUN: 10 mg/dL (ref 8–23)
CO2: 23 mmol/L (ref 22–32)
Calcium: 8.9 mg/dL (ref 8.9–10.3)
Chloride: 100 mmol/L (ref 98–111)
Creatinine, Ser: 0.73 mg/dL (ref 0.44–1.00)
GFR, Estimated: >60 mL/min (ref >60)
Glucose, Bld: 86 mg/dL (ref 70–99)
Potassium: 2.7 mmol/L — CL (ref 3.5–5.1)
Sodium: 131 mmol/L — ABNORMAL LOW (ref 135–145)

## 2021-01-04 LAB — MAGNESIUM: Magnesium: 1.7 mg/dL (ref 1.7–2.4)

## 2021-01-04 LAB — CBC
HCT: 27.8 % — ABNORMAL LOW (ref 36.0–46.0)
Hemoglobin: 9.3 g/dL — ABNORMAL LOW (ref 12.0–15.0)
MCH: 30.1 pg (ref 26.0–34.0)
MCHC: 33.5 g/dL (ref 30.0–36.0)
MCV: 90 fL (ref 80.0–100.0)
Platelets: 229 10*3/uL (ref 150–400)
RBC: 3.09 MIL/uL — ABNORMAL LOW (ref 3.87–5.11)
RDW: 15.2 % (ref 11.5–15.5)
WBC: 3.2 10*3/uL — ABNORMAL LOW (ref 4.0–10.5)
nRBC: 0 % (ref 0.0–0.2)

## 2021-01-04 MED ORDER — POTASSIUM CHLORIDE CRYS ER 20 MEQ PO TBCR
40.0000 meq | EXTENDED_RELEASE_TABLET | Freq: Once | ORAL | Status: AC
  Administered 2021-01-04: 40 meq via ORAL
  Filled 2021-01-04: qty 2

## 2021-01-04 MED ORDER — POTASSIUM CHLORIDE 10 MEQ/100ML IV SOLN
10.0000 meq | INTRAVENOUS | Status: DC
Start: 2021-01-04 — End: 2021-01-04

## 2021-01-04 MED ORDER — MAGNESIUM SULFATE 2 GM/50ML IV SOLN
2.0000 g | Freq: Once | INTRAVENOUS | Status: AC
  Administered 2021-01-04: 2 g via INTRAVENOUS
  Filled 2021-01-04: qty 50

## 2021-01-04 MED ORDER — ENSURE ENLIVE PO LIQD
237.0000 mL | Freq: Three times a day (TID) | ORAL | Status: DC
  Administered 2021-01-04 – 2021-01-05 (×3): 237 mL via ORAL

## 2021-01-04 NOTE — Care Management Important Message (Signed)
Important Message  Patient Details  Name: Tammy Shelton MRN: [id number] Date of Birth: 12-Jun-1950   Medicare Important Message Given:  N/A - LOS <3 / Initial given by admissions  Initial Medicare IM reviewed with patient at [phone] by Tomma Lightning, Patient Access Associate on 01/03/2021 at 11:36am.   Dannette Barbara 01/04/2021, 9:10 AM

## 2021-01-04 NOTE — Progress Notes (Signed)
PROGRESS NOTE    Tammy Shelton  BMW:[id number] DOB: 02/18/1951 DOA: 01/02/2021 PCP: Idelle Crouch, MD  151A/[id number]-AA   Assessment & Plan:   Active Problems:   CAP (community acquired pneumonia)   Malnutrition of moderate degree   Tammy Shelton is a 70 y.o. 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  She admits to dyspnea on exertion over the last couple of days as well as productive cough of clear sputum.   Sepsis 2/2 pneumonia  CAP or postobstructive   --presented with tachycardia and tachypnea  --started on Rocephin and Zithromax on admission Plan: --cont ceftriaxone and azithromycin day 3 --DuoNeb scheduled  Acute hypoxic respiratory failure, ruled out --no documented O2 sats <90%.  stage IV right lung adenocarcinoma --follows with oncology in [facility] --cont Mekinist and Taflinar  Hyponatremia, acute on chronic. --Na improved with IVF --Hold HCTZ  3.  Essential hypertension. - BP varied --cont losartan --hold amlodipine and HCTZ  4.  Dyslipidemia. - We will continue statin therapy.  Hypokalemia --monitor and replete with oral potassium   DVT prophylaxis: Lovenox SQ Code Status: Full code  Family Communication: daughter updated at bedside today Level of care: Med-Surg Dispo:   The patient is from: home Anticipated d/c is to: home Anticipated d/c date is: likely tomorrow Patient currently is not medically ready to d/c due to: on IV abx for PNA   Subjective and Interval History:  Pt complained of bed making her very uncomfortable.  Still having nausea and not eating well.    Objective: Vitals:   01/04/21 1123 01/04/21 1544 01/04/21 1555 01/04/21 1648  BP:   116/75   Pulse:   (!) 110 100  Resp:   15   Temp:   98.2 F (36.8 C)   TempSrc:      SpO2: 95% 92% 96%    Weight:      Height:        Intake/Output Summary (Last 24 hours) at 01/04/2021 1801 Last data filed at 01/04/2021 1401 Gross per 24 hour  Intake 100 ml  Output 1600 ml  Net -1500 ml   Filed Weights   01/02/21 1549  Weight: 56.7 kg    Examination:   Constitutional: NAD, AAOx3 HEENT: conjunctivae and lids normal, EOMI CV: No cyanosis.   RESP: normal respiratory effort, on RA Extremities: No effusions, edema in BLE SKIN: warm, dry Neuro: II - XII grossly intact.   Psych: Normal mood and affect.  Appropriate judgement and reason   Data Reviewed: I have personally reviewed following labs and imaging studies  CBC: Recent Labs  Lab 01/02/21 1551 01/03/21 0415 01/04/21 0427  WBC 4.0 3.1* 3.2*  NEUTROABS 3.5  --   --   HGB 10.1* 8.9* 9.3*  HCT 30.6* 27.5* 27.8*  MCV 88.7 90.2 90.0  PLT 240 212 272   Basic Metabolic Panel: Recent Labs  Lab 01/02/21 1551 01/03/21 0415 01/04/21 0427  NA 127* 131* 131*  K 3.3* 3.3* 2.7*  CL 95* 104 100  CO2 25 20* 23  GLUCOSE 127* 74 86  BUN 21 16 10   CREATININE 0.91 0.61 0.73  CALCIUM 8.6* 8.1* 8.9  MG  --   --  1.7   GFR: Estimated Creatinine Clearance: 50.2 mL/min (by C-G formula based on SCr of 0.73 mg/dL). Liver Function Tests: Recent Labs  Lab 01/02/21 1551  AST  44*  ALT 17  ALKPHOS 128*  BILITOT 0.4  PROT 6.3*  ALBUMIN 2.4*   No results for input(s): LIPASE, AMYLASE in the last 168 hours. No results for input(s): AMMONIA in the last 168 hours. Coagulation Profile: No results for input(s): INR, PROTIME in the last 168 hours. Cardiac Enzymes: No results for input(s): CKTOTAL, CKMB, CKMBINDEX, TROPONINI in the last 168 hours. BNP (last 3 results) No results for input(s): PROBNP in the last 8760 hours. HbA1C: No results for input(s): HGBA1C in the last 72 hours. CBG: No results for input(s): GLUCAP in the last 168 hours. Lipid Profile: No results for input(s): CHOL, HDL, LDLCALC, TRIG, CHOLHDL, LDLDIRECT in  the last 72 hours. Thyroid Function Tests: No results for input(s): TSH, T4TOTAL, FREET4, T3FREE, THYROIDAB in the last 72 hours. Anemia Panel: No results for input(s): VITAMINB12, FOLATE, FERRITIN, TIBC, IRON, RETICCTPCT in the last 72 hours. Sepsis Labs: Recent Labs  Lab 01/02/21 1908 01/02/21 2202 01/03/21 1114  PROCALCITON  --   --  1.11  LATICACIDVEN 0.9 1.5  --     Recent Results (from the past 240 hour(s))  Resp Panel by RT-PCR (Flu A&B, Covid) Nasopharyngeal Swab     Status: None   Collection Time: 01/02/21  3:51 PM   Specimen: Nasopharyngeal Swab; Nasopharyngeal(NP) swabs in vial transport medium  Result Value Ref Range Status   SARS Coronavirus 2 by RT PCR NEGATIVE NEGATIVE Final    Comment: (NOTE) SARS-CoV-2 target nucleic acids are NOT DETECTED.  The SARS-CoV-2 RNA is generally detectable in upper respiratory specimens during the acute phase of infection. The lowest concentration of SARS-CoV-2 viral copies this assay can detect is 138 copies/mL. A negative result does not preclude SARS-Cov-2 infection and should not be used as the sole basis for treatment or other patient management decisions. A negative result may occur with  improper specimen collection/handling, submission of specimen other than nasopharyngeal swab, presence of viral mutation(s) within the areas targeted by this assay, and inadequate number of viral copies(<138 copies/mL). A negative result must be combined with clinical observations, patient history, and epidemiological information. The expected result is Negative.  Fact Sheet for Patients:  EntrepreneurPulse.com.au  Fact Sheet for Healthcare Providers:  IncredibleEmployment.be  This test is no t yet approved or cleared by the [place] FDA and  has been authorized for detection and/or diagnosis of SARS-CoV-2 by FDA under an Emergency Use Authorization (EUA). This EUA will remain  in effect (meaning  this test can be used) for the duration of the COVID-19 declaration under Section 564(b)(1) of the Act, 21 U.S.C.section 360bbb-3(b)(1), unless the authorization is terminated  or revoked sooner.       Influenza A by PCR NEGATIVE NEGATIVE Final   Influenza B by PCR NEGATIVE NEGATIVE Final    Comment: (NOTE) The Xpert Xpress SARS-CoV-2/FLU/RSV plus assay is intended as an aid in the diagnosis of influenza from Nasopharyngeal swab specimens and should not be used as a sole basis for treatment. Nasal washings and aspirates are unacceptable for Xpert Xpress SARS-CoV-2/FLU/RSV testing.  Fact Sheet for Patients: EntrepreneurPulse.com.au  Fact Sheet for Healthcare Providers: IncredibleEmployment.be  This test is not yet approved or cleared by the [place] FDA and has been authorized for detection and/or diagnosis of SARS-CoV-2 by FDA under an Emergency Use Authorization (EUA). This EUA will remain in effect (meaning this test can be used) for the duration of the COVID-19 declaration under Section 564(b)(1) of the Act, 21 U.S.C. section 360bbb-3(b)(1), unless the authorization  is terminated or revoked.  Performed at [facility], [address] [place], [place] [address]   Blood culture (routine x 2)     Status: None (Preliminary result)   Collection Time: 01/02/21  7:08 PM   Specimen: BLOOD  Result Value Ref Range Status   Specimen Description BLOOD RIGHT ANTECUBITAL  Final   Special Requests   Final    BOTTLES DRAWN AEROBIC AND ANAEROBIC Blood Culture adequate volume   Culture   Final    NO GROWTH 2 DAYS Performed at [facility], [address] [place], [place] [address]    Report Status PENDING  Incomplete  Blood culture (routine x 2)     Status: None (Preliminary result)   Collection Time: 01/02/21  7:38 PM   Specimen: BLOOD  Result Value Ref Range Status   Specimen Description BLOOD RIGHT ANTECUBITAL  Final    Special Requests   Final    BOTTLES DRAWN AEROBIC AND ANAEROBIC Blood Culture adequate volume   Culture   Final    NO GROWTH 2 DAYS Performed at [facility], [address] [place],  [address]    Report Status PENDING  Incomplete      Radiology Studies: No results found.   Scheduled Meds:  cholecalciferol  1,000 Units Oral Daily   dabrafenib mesylate  100 mg Oral Q12H   enoxaparin (LOVENOX) injection  40 mg Subcutaneous Q24H   feeding supplement  237 mL Oral TID BM   guaiFENesin  600 mg Oral BID   ipratropium-albuterol  3 mL Nebulization QID   losartan  50 mg Oral Daily   pravastatin  20 mg Oral QHS   trametinib dimethyl sulfoxide  1.5 mg Oral QHS   cyanocobalamin  1,000 mcg Oral Daily   Continuous Infusions:  azithromycin Stopped (01/04/21 1733)   cefTRIAXone (ROCEPHIN)  IV Stopped (01/04/21 2993)     LOS: 2 days     Enzo Bi, MD [facility] If 7PM-7AM, please contact night-coverage 01/04/2021, 6:01 PM

## 2021-01-05 LAB — CBC
HCT: 28.6 % — ABNORMAL LOW (ref 36.0–46.0)
Hemoglobin: 9.5 g/dL — ABNORMAL LOW (ref 12.0–15.0)
MCH: 29.2 pg (ref 26.0–34.0)
MCHC: 33.2 g/dL (ref 30.0–36.0)
MCV: 88 fL (ref 80.0–100.0)
Platelets: 241 10*3/uL (ref 150–400)
RBC: 3.25 MIL/uL — ABNORMAL LOW (ref 3.87–5.11)
RDW: 15.2 % (ref 11.5–15.5)
WBC: 3.8 10*3/uL — ABNORMAL LOW (ref 4.0–10.5)
nRBC: 0 % (ref 0.0–0.2)

## 2021-01-05 LAB — BASIC METABOLIC PANEL
Anion gap: 4 — ABNORMAL LOW (ref 5–15)
BUN: 13 mg/dL (ref 8–23)
CO2: 29 mmol/L (ref 22–32)
Calcium: 9.2 mg/dL (ref 8.9–10.3)
Chloride: 101 mmol/L (ref 98–111)
Creatinine, Ser: 0.7 mg/dL (ref 0.44–1.00)
GFR, Estimated: >60 mL/min (ref >60)
Glucose, Bld: 101 mg/dL — ABNORMAL HIGH (ref 70–99)
Potassium: 3.8 mmol/L (ref 3.5–5.1)
Sodium: 134 mmol/L — ABNORMAL LOW (ref 135–145)

## 2021-01-05 LAB — MAGNESIUM: Magnesium: 2.2 mg/dL (ref 1.7–2.4)

## 2021-01-05 MED ORDER — ENSURE ENLIVE PO LIQD: 237.0000 mL | Freq: Three times a day (TID) | ORAL | 12 refills | Status: AC

## 2021-01-05 MED ORDER — AZITHROMYCIN 500 MG PO TABS: 500.0000 mg | ORAL_TABLET | Freq: Every day | ORAL | 0 refills | Status: AC

## 2021-01-05 MED ORDER — LEVOFLOXACIN 500 MG PO TABS: 500.0000 mg | ORAL_TABLET | Freq: Every day | ORAL | 0 refills | Status: AC

## 2021-01-05 NOTE — Progress Notes (Signed)
Patient discharged home via personal vehicle. IV removed. All belongings sent with patient. Home meds given to patient and daughter. VSS.  Discharge went over with opportunity to ask questions.

## 2021-01-05 NOTE — Plan of Care (Signed)
No acute events during the night. VSS. NAD noted.  Problem: Education: Goal: Knowledge of General Education information will improve Description: Including pain rating scale, medication(s)/side effects and non-pharmacologic comfort measures Outcome: Progressing   Problem: Pain Managment: Goal: General experience of comfort will improve Outcome: Progressing   Problem: Safety: Goal: Ability to remain free from injury will improve Outcome: Progressing   Problem: Skin Integrity: Goal: Risk for impaired skin integrity will decrease Outcome: Progressing

## 2021-01-05 NOTE — Discharge Summary (Signed)
Physician Discharge Summary   Tammy Shelton  female DOB: 1950/07/03  VCB:[id number]  PCP: Idelle Crouch, MD  Admit date: 01/02/2021 Discharge date: 01/05/2021  Admitted From: home Disposition:  home CODE STATUS: Full code  Discharge Instructions     Discharge instructions   Complete by: As directed    You have received 3 days of IV antibiotics for pneumonia.  Since you are doing well and want to go home, Dr. Grayland Ormond and I agree you are ok to go home to finish 2 more days of oral antibiotics with Azithromycin and Levaquin starting this afternoon 01/05/21.  Continue your scheduled followup with [place].   Dr. Enzo Bi [facility] Course:  For full details, please see H&P, progress notes, consult notes and ancillary notes.  Briefly,  Tammy Shelton is a 70 y.o. 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     Sepsis 2/2 pneumonia  CAP or postobstructive   --presented with tachycardia and tachypnea  --started on Rocephin and Zithromax on admission.   --Pt improved quickly and wanted to go home.  Pt received 3 days of IV antibiotics and was discharged to finish 2 more days of azithromycin and Levaquin.   Acute hypoxic respiratory failure, ruled out --no documented O2 sats <90%.   stage IV right lung adenocarcinoma --follows with oncology in [facility] --cont Mekinist and Taflinar  Hyponatremia, acute on chronic. --Na improved with IVF  Essential hypertension. - BP varied --Med reconciliation reported pt was not taking amlodipine and HCTZ. --cont losartan  Dyslipidemia. - continue statin therapy.   Hypokalemia --monitored and repleted with oral potassium   Discharge Diagnoses:  Active Problems:   CAP (community acquired pneumonia)   Malnutrition of moderate  degree   30 Day Unplanned Readmission Risk Score    Flowsheet Row ED to Hosp-Admission (Current) from 01/02/2021 in [facility] (1A)  30 Day Unplanned Readmission Risk Score (%) 17.58 Filed at 01/05/2021 0800       This score is the patient's risk of an unplanned readmission within 30 days of being discharged (0 -100%). The score is based on dignosis, age, lab data, medications, orders, and past utilization.   Low:  0-14.9   Medium: 15-21.9   High: 22-29.9   Extreme: 30 and above         Discharge Instructions:  Allergies as of 01/05/2021   No Known Allergies      Medication List     STOP taking these medications    amLODipine 10 MG tablet Commonly known as: NORVASC   hydrochlorothiazide 12.5 MG tablet Commonly known as: HYDRODIURIL   moxifloxacin 400 MG tablet Commonly known as: AVELOX   traMADol 50 MG tablet Commonly known as: ULTRAM   umeclidinium-vilanterol 62.5-25 MCG/INH Aepb Commonly known as: ANORO ELLIPTA       TAKE these medications    azithromycin 500 MG tablet Commonly known as: Zithromax Take 1 tablet (500 mg total) by mouth daily for 2 days. For pneumonia.   Cholecalciferol 25 MCG (1000 UT) capsule Take 1,000 Units by mouth daily.   cyanocobalamin 1000 MCG tablet Take 1,000 mcg by mouth daily.   dabrafenib mesylate 50 MG capsule Commonly known as: TAFLINAR Take 100 mg by mouth every 12 (twelve) hours.   feeding supplement Liqd Take 237 mLs  by mouth 3 (three) times daily between meals.   HYDROmorphone 2 MG tablet Commonly known as: DILAUDID Take 2 mg by mouth every 4 (four) hours as needed for pain.   levofloxacin 500 MG tablet Commonly known as: Levaquin Take 1 tablet (500 mg total) by mouth daily for 2 days. For pneumonia.   losartan 50 MG tablet Commonly known as: COZAAR Take 1 tablet by mouth daily.   lovastatin 20 MG tablet Commonly known as: MEVACOR Take 1 tablet by mouth daily with  supper.   prochlorperazine 10 MG tablet Commonly known as: COMPAZINE Take 10 mg by mouth every 6 (six) hours.   sodium chloride 1 g tablet Take 1 g by mouth daily as needed.   trametinib dimethyl sulfoxide 0.5 MG tablet Commonly known as: MEKINIST Take 1.5 mg by mouth at bedtime.         Follow-up Information     Idelle Crouch, MD Follow up in 1 week(s).   Specialty: Internal Medicine Contact information: [place] [address] [phone]         Your oncologist at [facility] Follow up.   Why: already scheduled followup.                No Known Allergies   The results of significant diagnostics from this hospitalization (including imaging, microbiology, ancillary and laboratory) are listed below for reference.   Consultations:   Procedures/Studies: CT Angio Chest PE W and/or Wo Contrast  Result Date: 01/02/2021 CLINICAL DATA:  Shortness of breath with exertion since yesterday. Hypotensive. History of lung cancer. EXAM: CT ANGIOGRAPHY CHEST WITH CONTRAST TECHNIQUE: Multidetector CT imaging of the chest was performed using the standard protocol during bolus administration of intravenous contrast. Multiplanar CT image reconstructions and MIPs were obtained to evaluate the vascular anatomy. CONTRAST:  71mL OMNIPAQUE IOHEXOL 350 MG/ML SOLN COMPARISON:  CT chest 09/13/2020, MRI thoracic and lumbar spine 09/04/2020 FINDINGS: Cardiovascular: Satisfactory opacification of the pulmonary arteries to the segmental level. Vascular cutoff of a segmental right lower lobe pulmonary artery likely due to obstruction from mass. Otherwise no definite pulmonary embolus. The main pulmonary artery is normal in caliber. Normal heart size. No significant pericardial effusion. The thoracic aorta is normal in caliber. At least mild atherosclerotic plaque of the thoracic aorta. At least 2 vessel coronary artery calcifications. Mediastinum/Nodes: No  definite enlarged mediastinal, hilar, or axillary lymph nodes. Thyroid gland, trachea, and esophagus demonstrate no significant findings. Lungs/Pleura: Slightly improved aeration of the right middle and right upper lobes with almost complete opacification of the right middle lobe and complete opacification of left lower lobe with air bronchograms. Interval worsening of interlobular septal wall thickening ground-glass consolidative opacity of the right upper lobe. Persistent trace to small volume right pleural effusion with associated nodularity. Limited evaluation of known lung mass and pleural lung masses due to timing of contrast. Redemonstration of endobronchial debris within the distal right middle and right lower lobe bronchi. Upper Abdomen: Fluid density lesions partially visualized within the kidneys likely represent simple renal cysts. Punctate calcifications associated with spleen likely sequelae of prior granulomatous disease. No acute abnormality. Musculoskeletal: No chest wall abnormality. Status post T4 through T11 fusion and kyphoplasty. Diffusely decreased bone density. Interval increase in thoracic as well as appendicular vertebral body sclerotic densities. Slightly more conspicuous chronic posterior element fracture of the T9 vertebral body. Marked worsening of a expansile right lateral T9 rib metastatic lesion with underlying pathologic fracture not excluded (5:301) no acute  displaced fracture. Multilevel severe degenerative changes of the spine. Chronic sternal as well as manubrial fracture. Review of the MIP images confirms the above findings. IMPRESSION: 1. Vascular cutoff of a segmental right lower lobe pulmonary artery likely due to obstruction from mass. Otherwise no definite pulmonary embolus. 2. Slightly improved aeration of the right middle and right upper lobes with however increased ground-glass consolidative opacities and interlobular septal thickening of the right upper lobe in a  patient with known malignancy. Persistent trace to small volume right pleural effusion with associated nodularity. Limited evaluation of known parenchymal and pleural malignancy due to timing of contrast. Given appearance of right upper lobe, a superimposed infection can not be excluded. 3. Interval increase in axial and appendicular osseous metastases. Marked worsening of a expansile right lateral T9 rib metastatic lesion with underlying pathologic fracture not excluded. Electronically Signed   By: Iven Finn M.D.   On: 01/02/2021 18:43   DG Chest Portable 1 View  Result Date: 01/02/2021 CLINICAL DATA:  Patient with c/o SOB since yesterday with exertion. Pt was hypotensive, lung cancer EXAM: PORTABLE CHEST 1 VIEW.  Patient is rotated. COMPARISON:  Chest x-ray 06/15/2019 FINDINGS: The heart and mediastinal contours are grossly unchanged given patient rotation. Interval increase interstitial opacities, right greater than left, with associated right patchy airspace opacity. Slightly increased in size, likely loculated, at least small to moderate volume right pleural effusion. No pneumothorax. No acute osseous abnormality. Status post thoracic spine surgical hardware. Marland Kitchen IMPRESSION: Interval increase interstitial opacities, right greater than left, with associated right patchy airspace opacity. Slightly increased in size, likely loculated, at least small to moderate volume right pleural effusion. Electronically Signed   By: Iven Finn M.D.   On: 01/02/2021 16:38      Labs: BNP (last 3 results) Recent Labs    01/02/21 1551  BNP 96.7   Basic Metabolic Panel: Recent Labs  Lab 01/02/21 1551 01/03/21 0415 01/04/21 0427 01/05/21 0547  NA 127* 131* 131* 134*  K 3.3* 3.3* 2.7* 3.8  CL 95* 104 100 101  CO2 25 20* 23 29  GLUCOSE 127* 74 86 101*  BUN 21 16 10 13   CREATININE 0.91 0.61 0.73 0.70  CALCIUM 8.6* 8.1* 8.9 9.2  MG  --   --  1.7 2.2   Liver Function Tests: Recent Labs  Lab  01/02/21 1551  AST 44*  ALT 17  ALKPHOS 128*  BILITOT 0.4  PROT 6.3*  ALBUMIN 2.4*   No results for input(s): LIPASE, AMYLASE in the last 168 hours. No results for input(s): AMMONIA in the last 168 hours. CBC: Recent Labs  Lab 01/02/21 1551 01/03/21 0415 01/04/21 0427 01/05/21 0547  WBC 4.0 3.1* 3.2* 3.8*  NEUTROABS 3.5  --   --   --   HGB 10.1* 8.9* 9.3* 9.5*  HCT 30.6* 27.5* 27.8* 28.6*  MCV 88.7 90.2 90.0 88.0  PLT 240 212 229 241   Cardiac Enzymes: No results for input(s): CKTOTAL, CKMB, CKMBINDEX, TROPONINI in the last 168 hours. BNP: Invalid input(s): POCBNP CBG: No results for input(s): GLUCAP in the last 168 hours. D-Dimer No results for input(s): DDIMER in the last 72 hours. Hgb A1c No results for input(s): HGBA1C in the last 72 hours. Lipid Profile No results for input(s): CHOL, HDL, LDLCALC, TRIG, CHOLHDL, LDLDIRECT in the last 72 hours. Thyroid function studies No results for input(s): TSH, T4TOTAL, T3FREE, THYROIDAB in the last 72 hours.  Invalid input(s): FREET3 Anemia work up No results for input(s):  VITAMINB12, FOLATE, FERRITIN, TIBC, IRON, RETICCTPCT in the last 72 hours. Urinalysis No results found for: COLORURINE, APPEARANCEUR, [place], [place], GLUCOSEU, [place], [place], [place], PROTEINUR, UROBILINOGEN, NITRITE, LEUKOCYTESUR Sepsis Labs Invalid input(s): PROCALCITONIN,  WBC,  LACTICIDVEN Microbiology Recent Results (from the past 240 hour(s))  Resp Panel by RT-PCR (Flu A&B, Covid) Nasopharyngeal Swab     Status: None   Collection Time: 01/02/21  3:51 PM   Specimen: Nasopharyngeal Swab; Nasopharyngeal(NP) swabs in vial transport medium  Result Value Ref Range Status   SARS Coronavirus 2 by RT PCR NEGATIVE NEGATIVE Final    Comment: (NOTE) SARS-CoV-2 target nucleic acids are NOT DETECTED.  The SARS-CoV-2 RNA is generally detectable in upper respiratory specimens during the acute phase of infection. The lowest concentration of  SARS-CoV-2 viral copies this assay can detect is 138 copies/mL. A negative result does not preclude SARS-Cov-2 infection and should not be used as the sole basis for treatment or other patient management decisions. A negative result may occur with  improper specimen collection/handling, submission of specimen other than nasopharyngeal swab, presence of viral mutation(s) within the areas targeted by this assay, and inadequate number of viral copies(<138 copies/mL). A negative result must be combined with clinical observations, patient history, and epidemiological information. The expected result is Negative.  Fact Sheet for Patients:  EntrepreneurPulse.com.au  Fact Sheet for Healthcare Providers:  IncredibleEmployment.be  This test is no t yet approved or cleared by the [place] FDA and  has been authorized for detection and/or diagnosis of SARS-CoV-2 by FDA under an Emergency Use Authorization (EUA). This EUA will remain  in effect (meaning this test can be used) for the duration of the COVID-19 declaration under Section 564(b)(1) of the Act, 21 U.S.C.section 360bbb-3(b)(1), unless the authorization is terminated  or revoked sooner.       Influenza A by PCR NEGATIVE NEGATIVE Final   Influenza B by PCR NEGATIVE NEGATIVE Final    Comment: (NOTE) The Xpert Xpress SARS-CoV-2/FLU/RSV plus assay is intended as an aid in the diagnosis of influenza from Nasopharyngeal swab specimens and should not be used as a sole basis for treatment. Nasal washings and aspirates are unacceptable for Xpert Xpress SARS-CoV-2/FLU/RSV testing.  Fact Sheet for Patients: EntrepreneurPulse.com.au  Fact Sheet for Healthcare Providers: IncredibleEmployment.be  This test is not yet approved or cleared by the [place] FDA and has been authorized for detection and/or diagnosis of SARS-CoV-2 by FDA under an Emergency Use  Authorization (EUA). This EUA will remain in effect (meaning this test can be used) for the duration of the COVID-19 declaration under Section 564(b)(1) of the Act, 21 U.S.C. section 360bbb-3(b)(1), unless the authorization is terminated or revoked.  Performed at [facility], [address] [place], [place] [address]   Blood culture (routine x 2)     Status: None (Preliminary result)   Collection Time: 01/02/21  7:08 PM   Specimen: BLOOD  Result Value Ref Range Status   Specimen Description BLOOD RIGHT ANTECUBITAL  Final   Special Requests   Final    BOTTLES DRAWN AEROBIC AND ANAEROBIC Blood Culture adequate volume   Culture   Final    NO GROWTH 3 DAYS Performed at [facility], [address] [place], [place] [address]    Report Status PENDING  Incomplete  Blood culture (routine x 2)     Status: None (Preliminary result)   Collection Time: 01/02/21  7:38 PM   Specimen: BLOOD  Result Value Ref Range Status   Specimen Description  BLOOD RIGHT ANTECUBITAL  Final   Special Requests   Final    BOTTLES DRAWN AEROBIC AND ANAEROBIC Blood Culture adequate volume   Culture   Final    NO GROWTH 3 DAYS Performed at [facility], [address] [place], [place] [address]    Report Status PENDING  Incomplete     Total time spend on discharging this patient, including the last patient exam, discussing the hospital stay, instructions for ongoing care as it relates to all pertinent caregivers, as well as preparing the medical discharge records, prescriptions, and/or referrals as applicable, is 30 minutes.    Enzo Bi, MD  [facility] 01/05/2021, 9:24 AM

## 2021-01-07 LAB — CULTURE, BLOOD (ROUTINE X 2)
Culture: NO GROWTH
Culture: NO GROWTH
Special Requests: ADEQUATE
Special Requests: ADEQUATE

## 2021-01-14 DIAGNOSIS — E861 Hypovolemia: Secondary | ICD-10-CM | POA: Diagnosis not present

## 2021-01-14 DIAGNOSIS — Z79899 Other long term (current) drug therapy: Secondary | ICD-10-CM | POA: Diagnosis not present

## 2021-01-14 DIAGNOSIS — I9589 Other hypotension: Secondary | ICD-10-CM | POA: Diagnosis not present

## 2021-01-14 DIAGNOSIS — C3491 Malignant neoplasm of unspecified part of right bronchus or lung: Secondary | ICD-10-CM | POA: Diagnosis not present

## 2021-01-14 DIAGNOSIS — R112 Nausea with vomiting, unspecified: Secondary | ICD-10-CM | POA: Diagnosis not present

## 2021-01-14 DIAGNOSIS — E871 Hypo-osmolality and hyponatremia: Secondary | ICD-10-CM | POA: Diagnosis not present

## 2021-01-14 DIAGNOSIS — K5900 Constipation, unspecified: Secondary | ICD-10-CM | POA: Diagnosis not present

## 2021-01-14 DIAGNOSIS — R220 Localized swelling, mass and lump, head: Secondary | ICD-10-CM | POA: Diagnosis not present

## 2021-01-14 DIAGNOSIS — N178 Other acute kidney failure: Secondary | ICD-10-CM | POA: Diagnosis not present

## 2021-01-14 DIAGNOSIS — E876 Hypokalemia: Secondary | ICD-10-CM | POA: Diagnosis not present

## 2021-01-14 DIAGNOSIS — R634 Abnormal weight loss: Secondary | ICD-10-CM | POA: Diagnosis not present

## 2021-01-14 DIAGNOSIS — C7989 Secondary malignant neoplasm of other specified sites: Secondary | ICD-10-CM | POA: Diagnosis not present

## 2021-01-14 DIAGNOSIS — I959 Hypotension, unspecified: Secondary | ICD-10-CM | POA: Diagnosis not present

## 2021-01-14 DIAGNOSIS — C7951 Secondary malignant neoplasm of bone: Secondary | ICD-10-CM | POA: Diagnosis not present

## 2021-01-14 DIAGNOSIS — C3431 Malignant neoplasm of lower lobe, right bronchus or lung: Secondary | ICD-10-CM | POA: Diagnosis not present

## 2021-02-04 DIAGNOSIS — C7951 Secondary malignant neoplasm of bone: Secondary | ICD-10-CM | POA: Diagnosis not present

## 2021-02-04 DIAGNOSIS — C3491 Malignant neoplasm of unspecified part of right bronchus or lung: Secondary | ICD-10-CM | POA: Diagnosis not present

## 2021-02-04 DIAGNOSIS — Z923 Personal history of irradiation: Secondary | ICD-10-CM | POA: Diagnosis not present

## 2021-02-04 DIAGNOSIS — G893 Neoplasm related pain (acute) (chronic): Secondary | ICD-10-CM | POA: Diagnosis not present

## 2021-02-04 DIAGNOSIS — R630 Anorexia: Secondary | ICD-10-CM | POA: Diagnosis not present

## 2021-02-04 DIAGNOSIS — C782 Secondary malignant neoplasm of pleura: Secondary | ICD-10-CM | POA: Diagnosis not present

## 2021-02-04 DIAGNOSIS — Z515 Encounter for palliative care: Secondary | ICD-10-CM | POA: Diagnosis not present

## 2021-02-04 DIAGNOSIS — G9520 Unspecified cord compression: Secondary | ICD-10-CM | POA: Diagnosis not present

## 2021-02-06 ENCOUNTER — Other Ambulatory Visit: Payer: Self-pay

## 2021-02-06 ENCOUNTER — Emergency Department: Payer: Medicare HMO

## 2021-02-06 ENCOUNTER — Emergency Department
Admission: EM | Admit: 2021-02-06 | Discharge: 2021-02-06 | Disposition: A | Discharge status: Home / Self Care | Payer: Medicare HMO | Attending: Emergency Medicine | Admitting: Emergency Medicine

## 2021-02-06 DIAGNOSIS — R0689 Other abnormalities of breathing: Secondary | ICD-10-CM | POA: Diagnosis not present

## 2021-02-06 DIAGNOSIS — Z79899 Other long term (current) drug therapy: Secondary | ICD-10-CM | POA: Diagnosis not present

## 2021-02-06 DIAGNOSIS — M545 Low back pain, unspecified: Secondary | ICD-10-CM | POA: Diagnosis not present

## 2021-02-06 DIAGNOSIS — J90 Pleural effusion, not elsewhere classified: Secondary | ICD-10-CM | POA: Diagnosis not present

## 2021-02-06 DIAGNOSIS — M549 Dorsalgia, unspecified: Secondary | ICD-10-CM | POA: Diagnosis not present

## 2021-02-06 DIAGNOSIS — Z87891 Personal history of nicotine dependence: Secondary | ICD-10-CM | POA: Insufficient documentation

## 2021-02-06 DIAGNOSIS — Z85118 Personal history of other malignant neoplasm of bronchus and lung: Secondary | ICD-10-CM | POA: Diagnosis not present

## 2021-02-06 DIAGNOSIS — J189 Pneumonia, unspecified organism: Secondary | ICD-10-CM

## 2021-02-06 DIAGNOSIS — Z20822 Contact with and (suspected) exposure to covid-19: Secondary | ICD-10-CM | POA: Insufficient documentation

## 2021-02-06 DIAGNOSIS — I129 Hypertensive chronic kidney disease with stage 1 through stage 4 chronic kidney disease, or unspecified chronic kidney disease: Secondary | ICD-10-CM | POA: Insufficient documentation

## 2021-02-06 DIAGNOSIS — N183 Chronic kidney disease, stage 3 unspecified: Secondary | ICD-10-CM | POA: Diagnosis not present

## 2021-02-06 DIAGNOSIS — R0602 Shortness of breath: Secondary | ICD-10-CM | POA: Diagnosis not present

## 2021-02-06 DIAGNOSIS — J181 Lobar pneumonia, unspecified organism: Secondary | ICD-10-CM | POA: Diagnosis not present

## 2021-02-06 DIAGNOSIS — R0902 Hypoxemia: Secondary | ICD-10-CM | POA: Diagnosis not present

## 2021-02-06 DIAGNOSIS — R509 Fever, unspecified: Secondary | ICD-10-CM | POA: Diagnosis not present

## 2021-02-06 LAB — URINALYSIS, COMPLETE (UACMP) WITH MICROSCOPIC
Bacteria, UA: NONE SEEN
Bilirubin Urine: NEGATIVE
Glucose, UA: NEGATIVE mg/dL
Hgb urine dipstick: NEGATIVE
Ketones, ur: NEGATIVE mg/dL
Leukocytes,Ua: NEGATIVE
Nitrite: NEGATIVE
Protein, ur: NEGATIVE mg/dL
Specific Gravity, Urine: 1.012 (ref 1.005–1.030)
Squamous Epithelial / HPF: NONE SEEN (ref 0–5)
pH: 7 (ref 5.0–8.0)

## 2021-02-06 LAB — COMPREHENSIVE METABOLIC PANEL
ALT: 19 U/L (ref 0–44)
AST: 43 U/L — ABNORMAL HIGH (ref 15–41)
Albumin: 3 g/dL — ABNORMAL LOW (ref 3.5–5.0)
Alkaline Phosphatase: 136 U/L — ABNORMAL HIGH (ref 38–126)
Anion gap: 9 (ref 5–15)
BUN: 21 mg/dL (ref 8–23)
CO2: 27 mmol/L (ref 22–32)
Calcium: 10.3 mg/dL (ref 8.9–10.3)
Chloride: 90 mmol/L — ABNORMAL LOW (ref 98–111)
Creatinine, Ser: 1.12 mg/dL — ABNORMAL HIGH (ref 0.44–1.00)
GFR, Estimated: 53 mL/min — ABNORMAL LOW (ref >60)
Glucose, Bld: 93 mg/dL (ref 70–99)
Potassium: 3.9 mmol/L (ref 3.5–5.1)
Sodium: 126 mmol/L — ABNORMAL LOW (ref 135–145)
Total Bilirubin: 0.7 mg/dL (ref 0.3–1.2)
Total Protein: 7.1 g/dL (ref 6.5–8.1)

## 2021-02-06 LAB — CBC WITH DIFFERENTIAL/PLATELET
Abs Immature Granulocytes: 0.01 10*3/uL (ref 0.00–0.07)
Basophils Absolute: 0 10*3/uL (ref 0.0–0.1)
Basophils Relative: 1 %
Eosinophils Absolute: 0 10*3/uL (ref 0.0–0.5)
Eosinophils Relative: 0 %
HCT: 35.3 % — ABNORMAL LOW (ref 36.0–46.0)
Hemoglobin: 11.7 g/dL — ABNORMAL LOW (ref 12.0–15.0)
Immature Granulocytes: 0 %
Lymphocytes Relative: 13 %
Lymphs Abs: 0.5 10*3/uL — ABNORMAL LOW (ref 0.7–4.0)
MCH: 29.3 pg (ref 26.0–34.0)
MCHC: 33.1 g/dL (ref 30.0–36.0)
MCV: 88.3 fL (ref 80.0–100.0)
Monocytes Absolute: 0.3 10*3/uL (ref 0.1–1.0)
Monocytes Relative: 7 %
Neutro Abs: 2.9 10*3/uL (ref 1.7–7.7)
Neutrophils Relative %: 79 %
Platelets: 225 10*3/uL (ref 150–400)
RBC: 4 MIL/uL (ref 3.87–5.11)
RDW: 14.9 % (ref 11.5–15.5)
WBC: 3.6 10*3/uL — ABNORMAL LOW (ref 4.0–10.5)
nRBC: 0 % (ref 0.0–0.2)

## 2021-02-06 LAB — PROCALCITONIN: Procalcitonin: 0.17 ng/mL

## 2021-02-06 LAB — RESP PANEL BY RT-PCR (FLU A&B, COVID) ARPGX2
Influenza A by PCR: NEGATIVE
Influenza B by PCR: NEGATIVE
SARS Coronavirus 2 by RT PCR: NEGATIVE

## 2021-02-06 LAB — LIPASE, BLOOD: Lipase: 29 U/L (ref 11–51)

## 2021-02-06 LAB — LACTIC ACID, PLASMA: Lactic Acid, Venous: 1.1 mmol/L (ref 0.5–1.9)

## 2021-02-06 MED ORDER — CEFDINIR 300 MG PO CAPS: 300.0000 mg | ORAL_CAPSULE | Freq: Two times a day (BID) | ORAL | 0 refills | Status: DC

## 2021-02-06 MED ORDER — SODIUM CHLORIDE 0.9 % IV BOLUS (SEPSIS)
1000.0000 mL | Freq: Once | INTRAVENOUS | Status: AC
  Administered 2021-02-06: 1000 mL via INTRAVENOUS

## 2021-02-06 MED ORDER — SODIUM CHLORIDE 0.9 % IV SOLN
500.0000 mg | INTRAVENOUS | Status: DC
Start: 2021-02-06 — End: 2021-02-07
  Administered 2021-02-06: 500 mg via INTRAVENOUS
  Filled 2021-02-06: qty 500

## 2021-02-06 MED ORDER — SODIUM CHLORIDE 0.9 % IV SOLN
2.0000 g | INTRAVENOUS | Status: DC
  Administered 2021-02-06: 2 g via INTRAVENOUS
  Filled 2021-02-06: qty 20

## 2021-02-06 MED ORDER — AZITHROMYCIN 250 MG PO TABS: ORAL_TABLET | ORAL | 0 refills | Status: DC

## 2021-02-06 NOTE — Consult Note (Signed)
CODE SEPSIS - PHARMACY COMMUNICATION  **Broad Spectrum Antibiotics should be administered within 1 hour of Sepsis diagnosis**  Time Code Sepsis Called/Page Received: 1542  Antibiotics Ordered: 1542  Time of 1st antibiotic administration: 1628  Additional action taken by pharmacy: N/A  If necessary, Name of Provider/Nurse Contacted: N/A    Darnelle Bos ,PharmD Clinical Pharmacist  02/06/2021  3:44 PM

## 2021-02-06 NOTE — ED Provider Notes (Signed)
[facility] Emergency Department Provider Note  ____________________________________________  Time seen: Approximately 6:16 PM  I have reviewed the triage vital signs and the nursing notes.   HISTORY  Chief Complaint Fever and Fall    HPI Tammy Shelton is a 70 y.o. female with a history of hypertension, CKD, lung cancer followed by [facility] who comes to the ED today due to fever of 102 at home.  Patient also reports that she had an episode of feeling weak when she tried to stand up out of bed.  Denies falling or hitting her head or significant trauma.  Denies any chest pain or abdominal pain with that, no headaches vision changes paresthesias or weakness.  She did not feel dizzy.    Symptoms have now resolved.  She was told to come to the ED for evaluation by her doctor due to the reported fever.   Past Medical History:  Diagnosis Date  . Hyperlipidemia   . Hypertension      Patient Active Problem List   Diagnosis Date Noted  . Malnutrition of moderate degree 01/04/2021  . CAP (community acquired pneumonia) 01/02/2021  . Anemia 07/29/2019  . Lung mass 04/23/2018  . History of vitamin D deficiency 02/13/2017  . Hyponatremia 01/12/2017  . Pure hypercholesterolemia 11/29/2015  . ANA positive 06/19/2015  . Arthralgia of multiple joints 06/19/2015  . CKD (chronic kidney disease) stage 3, GFR 30-59 ml/min (HCC) 06/19/2015  . Former smoker 05/29/2015  . Essential hypertension 08/23/2014     Past Surgical History:  Procedure Laterality Date  . CHOLECYSTECTOMY       Prior to Admission medications   Medication Sig Start Date End Date Taking? Authorizing Provider  azithromycin (ZITHROMAX Z-PAK) 250 MG tablet Take 2 tablets (500 mg) on  Day 1,  followed by 1 tablet (250 mg) once daily on Days 2 through 5. 02/06/21  Yes Carrie Mew, MD  cefdinir (OMNICEF) 300 MG capsule Take 1 capsule (300 mg total) by mouth 2 (two) times daily. 02/06/21  Yes  Carrie Mew, MD  Cholecalciferol 25 MCG (1000 UT) capsule Take 1,000 Units by mouth daily.    [provider]  cyanocobalamin 1000 MCG tablet Take 1,000 mcg by mouth daily.    [provider]  dabrafenib mesylate (TAFLINAR) 50 MG capsule Take 100 mg by mouth every 12 (twelve) hours. 10/25/20   [provider]  feeding supplement (ENSURE ENLIVE / ENSURE PLUS) LIQD Take 237 mLs by mouth 3 (three) times daily between meals. 01/05/21   Enzo Bi, MD  HYDROmorphone (DILAUDID) 2 MG tablet Take 2 mg by mouth every 4 (four) hours as needed for pain. 12/03/20   [provider]  losartan (COZAAR) 50 MG tablet Take 1 tablet by mouth daily. 02/09/19 01/02/21  [provider]  lovastatin (MEVACOR) 20 MG tablet Take 1 tablet by mouth daily with supper. 02/09/19   [provider]  prochlorperazine (COMPAZINE) 10 MG tablet Take 10 mg by mouth every 6 (six) hours. 12/28/20   [provider]  sodium chloride 1 g tablet Take 1 g by mouth daily as needed. 12/03/20   [provider]  trametinib dimethyl sulfoxide (MEKINIST) 0.5 MG tablet Take 1.5 mg by mouth at bedtime. 10/25/20   [provider]     Allergies Patient has no known allergies.   Family History  Problem Relation Age of Onset  . Breast cancer Neg Hx     Social History Social History   Tobacco Use  .  Smoking status: Former  . Smokeless tobacco: Never  Vaping Use  . Vaping Use: Never used  Substance Use Topics  . Alcohol use: Yes  . Drug use: No    Review of Systems  Constitutional: Positive fever at home ENT:   No sore throat. No rhinorrhea. Cardiovascular:   No chest pain or syncope. Respiratory:   No dyspnea or cough. Gastrointestinal:   Negative for abdominal pain, vomiting and diarrhea.  Musculoskeletal:   Negative for focal pain or swelling All other systems reviewed and are negative except as documented above in ROS and  HPI.  ____________________________________________   PHYSICAL EXAM:  VITAL SIGNS: ED Triage Vitals  Enc Vitals Group     BP 02/06/21 1518 (!) 144/88     Pulse Rate 02/06/21 1518 98     Resp 02/06/21 1518 20     Temp 02/06/21 1518 (!) 100.9 F (38.3 C)     Temp Source 02/06/21 1518 Oral     SpO2 02/06/21 1530 96 %     Weight 02/06/21 1520 124 lb (56.2 kg)     Height 02/06/21 1520 5\' 8"  (1.727 m)     Head Circumference --      Peak Flow --      Pain Score 02/06/21 1520 0     Pain Loc --      Pain Edu? --      Excl. in [place]? --     Vital signs reviewed, nursing assessments reviewed.   Constitutional:   Alert and oriented. Non-toxic appearance. Eyes:   Conjunctivae are normal. EOMI. PERRL. ENT      Head:   Normocephalic and atraumatic.      Nose:   Wearing a mask.      Mouth/Throat:   Wearing a mask.      Neck:   No meningismus. Full ROM. Hematological/Lymphatic/Immunilogical:   No cervical lymphadenopathy. Cardiovascular:   RRR. Symmetric bilateral radial and DP pulses.  No murmurs. Cap refill less than 2 seconds. Respiratory:   Normal respiratory effort without tachypnea/retractions.  Diminished breath sounds in the right lower lung.   Gastrointestinal:   Soft and nontender. Non distended. There is no CVA tenderness.  No rebound, rigidity, or guarding. Genitourinary:   deferred Musculoskeletal:   Normal range of motion in all extremities. No joint effusions.  No lower extremity tenderness.  No edema. Neurologic:   Normal speech and language.  Motor grossly intact. No acute focal neurologic deficits are appreciated.  Skin:    Skin is warm, dry and intact. No rash noted.  No petechiae, purpura, or bullae.  ____________________________________________    LABS (pertinent positives/negatives) (all labs ordered are listed, but only abnormal results are displayed) Labs Reviewed  COMPREHENSIVE METABOLIC PANEL - Abnormal; Notable for the following components:      Result  Value   Sodium 126 (*)    Chloride 90 (*)    Creatinine, Ser 1.12 (*)    Albumin 3.0 (*)    AST 43 (*)    Alkaline Phosphatase 136 (*)    GFR, Estimated 53 (*)    All other components within normal limits  CBC WITH DIFFERENTIAL/PLATELET - Abnormal; Notable for the following components:   WBC 3.6 (*)    Hemoglobin 11.7 (*)    HCT 35.3 (*)    Lymphs Abs 0.5 (*)    All other components within normal limits  URINALYSIS, COMPLETE (UACMP) WITH MICROSCOPIC - Abnormal; Notable for the following components:   Color, Urine YELLOW (*)  APPearance CLEAR (*)    All other components within normal limits  RESP PANEL BY RT-PCR (FLU A&B, COVID) ARPGX2  CULTURE, BLOOD (ROUTINE X 2)  CULTURE, BLOOD (ROUTINE X 2)  URINE CULTURE  LACTIC ACID, PLASMA  LIPASE, BLOOD  PROCALCITONIN  LACTIC ACID, PLASMA   ____________________________________________   EKG  Interpreted by me Sinus rhythm rate of 86, normal axis and intervals.  Normal QRS ST segments and T waves.  No acute ischemic changes.  ____________________________________________    RADIOLOGY  DG Chest Port 1 View  Result Date: 02/06/2021 CLINICAL DATA:  Questionable sepsis.  Evaluate for abnormality. EXAM: PORTABLE CHEST 1 VIEW COMPARISON:  Chest x-ray 01/02/2021. CT angiography chest 01/02/2021, CT chest 09/13/2020 FINDINGS: The heart and mediastinal contours are unchanged Persistent right lung interstitial and airspace opacities. Persistent at least small to moderate volume right pleural effusion. No left pleural effusion. No pneumothorax. No acute osseous abnormality. IMPRESSION: Persistent right lung interstitial and airspace opacities as well as an at least small to moderate volume loculated right pleural effusion. Electronically Signed   By: Iven Finn M.D.   On: 02/06/2021 16:05    ____________________________________________   PROCEDURES Procedures  ____________________________________________  DIFFERENTIAL DIAGNOSIS    Pneumonia, UTI, electrolyte abnormality, dehydration, COVID/flu/viral illness  CLINICAL IMPRESSION / ASSESSMENT AND PLAN / ED COURSE  Medications ordered in the ED: Medications  cefTRIAXone (ROCEPHIN) 2 g in sodium chloride 0.9 % 100 mL IVPB (0 g Intravenous Stopped 02/06/21 1732)  azithromycin (ZITHROMAX) 500 mg in sodium chloride 0.9 % 250 mL IVPB (has no administration in time range)  sodium chloride 0.9 % bolus 1,000 mL (0 mLs Intravenous Stopped 02/06/21 1746)    Pertinent labs & imaging results that were available during my care of the patient were reviewed by me and considered in my medical decision making (see chart for details).  Tammy Shelton was evaluated in Emergency Department on 02/06/2021 for the symptoms described in the history of present illness. She was evaluated in the context of the global COVID-19 pandemic, which necessitated consideration that the patient might be at risk for infection with the SARS-CoV-2 virus that causes COVID-19. Institutional protocols and algorithms that pertain to the evaluation of patients at risk for COVID-19 are in a state of rapid change based on information released by regulatory bodies including the CDC and federal and state organizations. These policies and algorithms were followed during the patient's care in the ED.   Patient presents with reported fever of 102 at home, some shortness of breath earlier today that is now resolved.  She states that she feels fine and has no acute symptoms at the moment.  Due to the patient's age and underlying health issues including lung cancer under treatment, broad work-up was undertaken which is reassuring.  Procalcitonin is low, COVID and flu are negative, chest x-ray shows some chronic opacification of the right lower lung which is compatible with her exam, not clearly acute.  There may be postobstructive pneumonia.  She is not septic.  Doubt ACS PE dissection or intra-abdominal pathology.  Patient  wishes to try initial treatment at home so after giving ceftriaxone and azithromycin in the ED, I will prescribe Omnicef and azithromycin.  She has an appointment to follow-up with her cancer doctor already in 5 days.  Return precautions discussed and she will come back to the ED if she has any worsening symptoms.  Clinical Course as of 02/06/21 Coral Ceo Feb 06, 2021  1821 Lab panel is  reassuring and unremarkable except for a sodium level of 126 which patient reports is a chronic recurring issue for her.  She was given IV saline for hydration in the ED, does not exhibit any symptoms such as weakness confusion or seizure. [PS]    Clinical Course User Index [PS] Carrie Mew, MD     ____________________________________________   FINAL CLINICAL IMPRESSION(S) / ED DIAGNOSES    Final diagnoses:  Community acquired pneumonia of right lower lobe of lung     ED Discharge Orders          Ordered    cefdinir (OMNICEF) 300 MG capsule  2 times daily        02/06/21 1816    azithromycin (ZITHROMAX Z-PAK) 250 MG tablet        02/06/21 1816            Portions of this note were generated with dragon dictation software. Dictation errors may occur despite best attempts at proofreading.    Carrie Mew, MD 02/06/21 [phone]

## 2021-02-06 NOTE — Sepsis Progress Note (Signed)
Notified bedside nurse of need to draw lactic acid & blood cultures.

## 2021-02-06 NOTE — ED Triage Notes (Signed)
Patient to ED via EMS from home for c/o fever x 1 day and fall. Patient states she was on her way to her MD for a CXR when she became weak in the legs and slid to the floor. Patient denies hitting head or LOC. Patient denies pain. Denies SOB.

## 2021-02-06 NOTE — Sepsis Progress Note (Signed)
Code sepsis d/c at 1819. eLink will sign off of monitoring at this time.

## 2021-02-06 NOTE — Sepsis Progress Note (Signed)
eLink monitoring code sepsis.  

## 2021-02-06 NOTE — Sepsis Progress Note (Signed)
Per bedside RN, labs sent.

## 2021-02-08 LAB — URINE CULTURE: Culture: NO GROWTH

## 2021-02-11 DIAGNOSIS — G893 Neoplasm related pain (acute) (chronic): Secondary | ICD-10-CM | POA: Diagnosis not present

## 2021-02-11 DIAGNOSIS — C3491 Malignant neoplasm of unspecified part of right bronchus or lung: Secondary | ICD-10-CM | POA: Diagnosis not present

## 2021-02-11 DIAGNOSIS — J929 Pleural plaque without asbestos: Secondary | ICD-10-CM | POA: Diagnosis not present

## 2021-02-11 DIAGNOSIS — C782 Secondary malignant neoplasm of pleura: Secondary | ICD-10-CM | POA: Diagnosis not present

## 2021-02-11 DIAGNOSIS — C3490 Malignant neoplasm of unspecified part of unspecified bronchus or lung: Secondary | ICD-10-CM | POA: Diagnosis not present

## 2021-02-11 DIAGNOSIS — E871 Hypo-osmolality and hyponatremia: Secondary | ICD-10-CM | POA: Diagnosis not present

## 2021-02-11 DIAGNOSIS — Z79899 Other long term (current) drug therapy: Secondary | ICD-10-CM | POA: Diagnosis not present

## 2021-02-11 DIAGNOSIS — C7951 Secondary malignant neoplasm of bone: Secondary | ICD-10-CM | POA: Diagnosis not present

## 2021-02-11 DIAGNOSIS — J181 Lobar pneumonia, unspecified organism: Secondary | ICD-10-CM | POA: Diagnosis not present

## 2021-02-11 DIAGNOSIS — I2699 Other pulmonary embolism without acute cor pulmonale: Secondary | ICD-10-CM | POA: Diagnosis not present

## 2021-02-11 DIAGNOSIS — R918 Other nonspecific abnormal finding of lung field: Secondary | ICD-10-CM | POA: Diagnosis not present

## 2021-02-11 LAB — CULTURE, BLOOD (ROUTINE X 2)
Culture: NO GROWTH
Culture: NO GROWTH
Special Requests: ADEQUATE
Special Requests: ADEQUATE

## 2021-03-06 DIAGNOSIS — G893 Neoplasm related pain (acute) (chronic): Secondary | ICD-10-CM | POA: Diagnosis not present

## 2021-03-06 DIAGNOSIS — Z515 Encounter for palliative care: Secondary | ICD-10-CM | POA: Diagnosis not present

## 2021-03-06 DIAGNOSIS — K5909 Other constipation: Secondary | ICD-10-CM | POA: Diagnosis not present

## 2021-03-11 DIAGNOSIS — C3492 Malignant neoplasm of unspecified part of left bronchus or lung: Secondary | ICD-10-CM | POA: Diagnosis not present

## 2021-03-11 DIAGNOSIS — C3491 Malignant neoplasm of unspecified part of right bronchus or lung: Secondary | ICD-10-CM | POA: Diagnosis not present

## 2021-03-11 DIAGNOSIS — C7951 Secondary malignant neoplasm of bone: Secondary | ICD-10-CM | POA: Diagnosis not present

## 2021-03-11 DIAGNOSIS — C782 Secondary malignant neoplasm of pleura: Secondary | ICD-10-CM | POA: Diagnosis not present

## 2021-03-11 DIAGNOSIS — G893 Neoplasm related pain (acute) (chronic): Secondary | ICD-10-CM | POA: Diagnosis not present

## 2021-03-11 DIAGNOSIS — Z79899 Other long term (current) drug therapy: Secondary | ICD-10-CM | POA: Diagnosis not present

## 2021-03-27 DIAGNOSIS — Z981 Arthrodesis status: Secondary | ICD-10-CM | POA: Diagnosis not present

## 2021-03-27 DIAGNOSIS — C7951 Secondary malignant neoplasm of bone: Secondary | ICD-10-CM | POA: Diagnosis not present

## 2021-03-27 DIAGNOSIS — Z23 Encounter for immunization: Secondary | ICD-10-CM | POA: Diagnosis not present

## 2021-04-15 DIAGNOSIS — E871 Hypo-osmolality and hyponatremia: Secondary | ICD-10-CM | POA: Diagnosis not present

## 2021-04-15 DIAGNOSIS — J929 Pleural plaque without asbestos: Secondary | ICD-10-CM | POA: Diagnosis not present

## 2021-04-15 DIAGNOSIS — C3492 Malignant neoplasm of unspecified part of left bronchus or lung: Secondary | ICD-10-CM | POA: Diagnosis not present

## 2021-04-15 DIAGNOSIS — G9520 Unspecified cord compression: Secondary | ICD-10-CM | POA: Diagnosis not present

## 2021-04-15 DIAGNOSIS — C3491 Malignant neoplasm of unspecified part of right bronchus or lung: Secondary | ICD-10-CM | POA: Diagnosis not present

## 2021-04-15 DIAGNOSIS — N179 Acute kidney failure, unspecified: Secondary | ICD-10-CM | POA: Diagnosis not present

## 2021-04-15 DIAGNOSIS — C782 Secondary malignant neoplasm of pleura: Secondary | ICD-10-CM | POA: Diagnosis not present

## 2021-04-15 DIAGNOSIS — C7951 Secondary malignant neoplasm of bone: Secondary | ICD-10-CM | POA: Diagnosis not present

## 2021-04-15 DIAGNOSIS — C3490 Malignant neoplasm of unspecified part of unspecified bronchus or lung: Secondary | ICD-10-CM | POA: Diagnosis not present

## 2021-04-15 DIAGNOSIS — M546 Pain in thoracic spine: Secondary | ICD-10-CM | POA: Diagnosis not present

## 2021-04-15 DIAGNOSIS — G893 Neoplasm related pain (acute) (chronic): Secondary | ICD-10-CM | POA: Diagnosis not present

## 2021-04-24 DIAGNOSIS — C3491 Malignant neoplasm of unspecified part of right bronchus or lung: Secondary | ICD-10-CM | POA: Diagnosis not present

## 2021-04-24 DIAGNOSIS — G9520 Unspecified cord compression: Secondary | ICD-10-CM | POA: Diagnosis not present

## 2021-04-24 DIAGNOSIS — G893 Neoplasm related pain (acute) (chronic): Secondary | ICD-10-CM | POA: Diagnosis not present

## 2021-04-24 DIAGNOSIS — R630 Anorexia: Secondary | ICD-10-CM | POA: Diagnosis not present

## 2021-04-24 DIAGNOSIS — C7951 Secondary malignant neoplasm of bone: Secondary | ICD-10-CM | POA: Diagnosis not present

## 2021-04-24 DIAGNOSIS — C782 Secondary malignant neoplasm of pleura: Secondary | ICD-10-CM | POA: Diagnosis not present

## 2021-05-23 ENCOUNTER — Other Ambulatory Visit: Payer: Self-pay

## 2021-05-23 ENCOUNTER — Encounter: Payer: Self-pay | Admitting: *Deleted

## 2021-05-23 ENCOUNTER — Emergency Department: Payer: Medicare HMO

## 2021-05-23 DIAGNOSIS — Z20822 Contact with and (suspected) exposure to covid-19: Secondary | ICD-10-CM | POA: Diagnosis not present

## 2021-05-23 DIAGNOSIS — Z7901 Long term (current) use of anticoagulants: Secondary | ICD-10-CM | POA: Diagnosis not present

## 2021-05-23 DIAGNOSIS — Z85118 Personal history of other malignant neoplasm of bronchus and lung: Secondary | ICD-10-CM | POA: Diagnosis not present

## 2021-05-23 DIAGNOSIS — R29818 Other symptoms and signs involving the nervous system: Principal | ICD-10-CM | POA: Insufficient documentation

## 2021-05-23 DIAGNOSIS — N179 Acute kidney failure, unspecified: Secondary | ICD-10-CM | POA: Insufficient documentation

## 2021-05-23 DIAGNOSIS — R531 Weakness: Secondary | ICD-10-CM | POA: Diagnosis present

## 2021-05-23 DIAGNOSIS — R296 Repeated falls: Secondary | ICD-10-CM | POA: Diagnosis not present

## 2021-05-23 DIAGNOSIS — R000 Tachycardia, unspecified: Secondary | ICD-10-CM | POA: Insufficient documentation

## 2021-05-23 DIAGNOSIS — S0990XA Unspecified injury of head, initial encounter: Secondary | ICD-10-CM | POA: Diagnosis not present

## 2021-05-23 DIAGNOSIS — I2699 Other pulmonary embolism without acute cor pulmonale: Secondary | ICD-10-CM | POA: Diagnosis not present

## 2021-05-23 DIAGNOSIS — G319 Degenerative disease of nervous system, unspecified: Secondary | ICD-10-CM | POA: Diagnosis not present

## 2021-05-23 DIAGNOSIS — I639 Cerebral infarction, unspecified: Secondary | ICD-10-CM | POA: Diagnosis not present

## 2021-05-23 LAB — BASIC METABOLIC PANEL
Anion gap: 11 (ref 5–15)
BUN: 33 mg/dL — ABNORMAL HIGH (ref 8–23)
CO2: 30 mmol/L (ref 22–32)
Calcium: 11.1 mg/dL — ABNORMAL HIGH (ref 8.9–10.3)
Chloride: 89 mmol/L — ABNORMAL LOW (ref 98–111)
Creatinine, Ser: 2.7 mg/dL — ABNORMAL HIGH (ref 0.44–1.00)
GFR, Estimated: 18 mL/min — ABNORMAL LOW (ref >60)
Glucose, Bld: 119 mg/dL — ABNORMAL HIGH (ref 70–99)
Potassium: 4.4 mmol/L (ref 3.5–5.1)
Sodium: 130 mmol/L — ABNORMAL LOW (ref 135–145)

## 2021-05-23 LAB — CBC
HCT: 32.2 % — ABNORMAL LOW (ref 36.0–46.0)
Hemoglobin: 10.4 g/dL — ABNORMAL LOW (ref 12.0–15.0)
MCH: 27.2 pg (ref 26.0–34.0)
MCHC: 32.3 g/dL (ref 30.0–36.0)
MCV: 84.1 fL (ref 80.0–100.0)
Platelets: 519 10*3/uL — ABNORMAL HIGH (ref 150–400)
RBC: 3.83 MIL/uL — ABNORMAL LOW (ref 3.87–5.11)
RDW: 15.8 % — ABNORMAL HIGH (ref 11.5–15.5)
WBC: 11 10*3/uL — ABNORMAL HIGH (ref 4.0–10.5)
nRBC: 0 % (ref 0.0–0.2)

## 2021-05-23 LAB — TROPONIN I (HIGH SENSITIVITY): Troponin I (High Sensitivity): 11 ng/L (ref <18)

## 2021-05-23 NOTE — ED Triage Notes (Addendum)
Pt brought in via wheelchair.  Pt reports recent falls at home.  Pt now has left arm and left leg weakness.  No headache.  Pt wearing a left knee brace in triage.  Pt alert.  Pt is on blood thinner.  Pt fell yesterday and 2 days ago.

## 2021-05-24 ENCOUNTER — Emergency Department: Payer: Medicare HMO

## 2021-05-24 ENCOUNTER — Encounter: Payer: Self-pay | Admitting: Internal Medicine

## 2021-05-24 ENCOUNTER — Observation Stay
Admission: EM | Admit: 2021-05-24 | Discharge: 2021-05-25 | Disposition: A | Discharge status: Home / Self Care | Payer: Medicare HMO | Attending: Hospitalist | Admitting: Hospitalist

## 2021-05-24 DIAGNOSIS — N179 Acute kidney failure, unspecified: Secondary | ICD-10-CM

## 2021-05-24 DIAGNOSIS — R2990 Unspecified symptoms and signs involving the nervous system: Secondary | ICD-10-CM

## 2021-05-24 DIAGNOSIS — R29818 Other symptoms and signs involving the nervous system: Secondary | ICD-10-CM

## 2021-05-24 DIAGNOSIS — C3491 Malignant neoplasm of unspecified part of right bronchus or lung: Secondary | ICD-10-CM | POA: Diagnosis present

## 2021-05-24 DIAGNOSIS — R638 Other symptoms and signs concerning food and fluid intake: Secondary | ICD-10-CM

## 2021-05-24 DIAGNOSIS — R531 Weakness: Secondary | ICD-10-CM

## 2021-05-24 DIAGNOSIS — G893 Neoplasm related pain (acute) (chronic): Secondary | ICD-10-CM | POA: Diagnosis present

## 2021-05-24 DIAGNOSIS — G319 Degenerative disease of nervous system, unspecified: Secondary | ICD-10-CM | POA: Diagnosis not present

## 2021-05-24 DIAGNOSIS — R296 Repeated falls: Secondary | ICD-10-CM

## 2021-05-24 DIAGNOSIS — Z7901 Long term (current) use of anticoagulants: Secondary | ICD-10-CM

## 2021-05-24 LAB — URINALYSIS, ROUTINE W REFLEX MICROSCOPIC
Bilirubin Urine: NEGATIVE
Glucose, UA: NEGATIVE mg/dL
Ketones, ur: NEGATIVE mg/dL
Leukocytes,Ua: NEGATIVE
Nitrite: NEGATIVE
Protein, ur: NEGATIVE mg/dL
Specific Gravity, Urine: 1.004 — ABNORMAL LOW (ref 1.005–1.030)
Squamous Epithelial / HPF: NONE SEEN (ref 0–5)
pH: 9 — ABNORMAL HIGH (ref 5.0–8.0)

## 2021-05-24 LAB — RESP PANEL BY RT-PCR (FLU A&B, COVID) ARPGX2
Influenza A by PCR: NEGATIVE
Influenza B by PCR: NEGATIVE
SARS Coronavirus 2 by RT PCR: NEGATIVE

## 2021-05-24 LAB — BASIC METABOLIC PANEL
Anion gap: 10 (ref 5–15)
BUN: 32 mg/dL — ABNORMAL HIGH (ref 8–23)
CO2: 25 mmol/L (ref 22–32)
Calcium: 9.7 mg/dL (ref 8.9–10.3)
Chloride: 98 mmol/L (ref 98–111)
Creatinine, Ser: 2.5 mg/dL — ABNORMAL HIGH (ref 0.44–1.00)
GFR, Estimated: 20 mL/min — ABNORMAL LOW (ref >60)
Glucose, Bld: 95 mg/dL (ref 70–99)
Potassium: 3.5 mmol/L (ref 3.5–5.1)
Sodium: 133 mmol/L — ABNORMAL LOW (ref 135–145)

## 2021-05-24 LAB — TROPONIN I (HIGH SENSITIVITY): Troponin I (High Sensitivity): 7 ng/L (ref <18)

## 2021-05-24 LAB — MAGNESIUM: Magnesium: 2.2 mg/dL (ref 1.7–2.4)

## 2021-05-24 MED ORDER — ACETAMINOPHEN 325 MG PO TABS: 650.0000 mg | ORAL_TABLET | Freq: Four times a day (QID) | ORAL | Status: DC | PRN

## 2021-05-24 MED ORDER — APIXABAN 5 MG PO TABS
5.0000 mg | ORAL_TABLET | Freq: Two times a day (BID) | ORAL | Status: DC
  Administered 2021-05-24 – 2021-05-25 (×2): 5 mg via ORAL
  Filled 2021-05-24 (×2): qty 1

## 2021-05-24 MED ORDER — OLANZAPINE 5 MG PO TABS
5.0000 mg | ORAL_TABLET | Freq: Every day | ORAL | Status: DC
  Filled 2021-05-24 (×2): qty 1

## 2021-05-24 MED ORDER — HYDROMORPHONE HCL 2 MG PO TABS
2.0000 mg | ORAL_TABLET | ORAL | Status: DC | PRN
Start: 2021-05-24 — End: 2021-05-25

## 2021-05-24 MED ORDER — ENSURE ENLIVE PO LIQD
237.0000 mL | Freq: Two times a day (BID) | ORAL | Status: DC
  Administered 2021-05-24: 237 mL via ORAL

## 2021-05-24 MED ORDER — SODIUM CHLORIDE 0.9 % IV BOLUS
1000.0000 mL | Freq: Once | INTRAVENOUS | Status: AC
Start: 2021-05-24 — End: 2021-05-24
  Administered 2021-05-24: 1000 mL via INTRAVENOUS

## 2021-05-24 MED ORDER — ASPIRIN EC 81 MG PO TBEC
81.0000 mg | DELAYED_RELEASE_TABLET | Freq: Every day | ORAL | Status: DC
  Administered 2021-05-25: 09:00:00 81 mg via ORAL
  Filled 2021-05-24: qty 1

## 2021-05-24 MED ORDER — ONDANSETRON HCL 4 MG/2ML IJ SOLN: 4.0000 mg | Freq: Four times a day (QID) | INTRAMUSCULAR | Status: DC | PRN

## 2021-05-24 MED ORDER — ENSURE ENLIVE PO LIQD
237.0000 mL | Freq: Three times a day (TID) | ORAL | Status: DC
  Administered 2021-05-24 – 2021-05-25 (×2): 237 mL via ORAL

## 2021-05-24 MED ORDER — SODIUM CHLORIDE 0.9 % IV SOLN: INTRAVENOUS | Status: DC

## 2021-05-24 MED ORDER — ACETAMINOPHEN 650 MG RE SUPP: 650.0000 mg | Freq: Four times a day (QID) | RECTAL | Status: DC | PRN

## 2021-05-24 MED ORDER — ONDANSETRON HCL 4 MG PO TABS: 4.0000 mg | ORAL_TABLET | Freq: Four times a day (QID) | ORAL | Status: DC | PRN

## 2021-05-24 MED ORDER — ADULT MULTIVITAMIN W/MINERALS CH
1.0000 | ORAL_TABLET | Freq: Every day | ORAL | Status: DC
  Administered 2021-05-24 – 2021-05-25 (×2): 1 via ORAL
  Filled 2021-05-24 (×2): qty 1

## 2021-05-24 NOTE — Evaluation (Signed)
Occupational Therapy Evaluation Patient Details Name: Tammy Shelton MRN: [id number] DOB: October 22, 1950 Today's Date: 05/24/2021   History of Present Illness Tammy Shelton is a 71 y.o. female with medical history significant for Stage IV lung cancer on chemotherapy/immunomodulator therapy from [facility], spinal metastases with cord compression, s/p spinal fusion, HTN,  PE on Eliquis, who presents to the ED with frequent falls, generalized weakness, poor oral intake worsening over the past 2 days.  Additionally she has noted intermittent left-sided weakness to where she has difficulty holding anything in her hand, Not present at the time of evaluation.  She has had no vomiting or diarrhea.  Denies shortness of breath, chest pain or abdominal pain and denies dysuria   Clinical Impression   Patient presenting with decreased Ind in self care, balance, functional mobility/transfers, endurance, and safety awareness. Patient reports living at home alone with use of rollator at baseline. She does not drive. Her daugther assist her with IADL tasks, taking her to appointments, and checking in daily.Patient currently functioning at supervision with use of RW for functional mobility and self care tasks. Pt remains on RA but RR deoes increase to 30's with activity. RN notified.  Patient will benefit from acute OT to increase overall independence in the areas of ADLs, functional mobility,and safety awareness in order to safely discharge home.     Recommendations for follow up therapy are one component of a multi-disciplinary discharge planning process, led by the attending physician.  Recommendations may be updated based on patient status, additional functional criteria and insurance authorization.   Follow Up Recommendations  Home health OT    Assistance Recommended at Discharge Set up Supervision/Assistance     Functional Status Assessment  Patient has had a recent decline in their functional status and demonstrates  the ability to make significant improvements in function in a reasonable and predictable amount of time.  Equipment Recommendations  None recommended by OT       Precautions / Restrictions Precautions Precautions: Fall      Mobility Bed Mobility Overal bed mobility: Needs Assistance Bed Mobility: Supine to Sit, Sit to Supine     Supine to sit: Supervision Sit to supine: Supervision   General bed mobility comments: from ED stretcher    Transfers Overall transfer level: Needs assistance Equipment used: Rolling walker (2 wheels) Transfers: Sit to/from Stand Sit to Stand: Supervision                  Balance Overall balance assessment: Needs assistance Sitting-balance support: Feet supported Sitting balance-Leahy Scale: Good     Standing balance support: During functional activity, Reliant on assistive device for balance Standing balance-Leahy Scale: Fair                             ADL either performed or assessed with clinical judgement   ADL                                         General ADL Comments: supervision overall for functional mobility, toileting, cothing management and hygiene     Vision Patient Visual Report: No change from baseline              Pertinent Vitals/Pain Pain Assessment Pain Assessment: No/denies pain     Hand Dominance Right   Extremity/Trunk Assessment Upper Extremity Assessment Upper Extremity Assessment:  Overall [facility] for tasks assessed;Generalized weakness   Lower Extremity Assessment Lower Extremity Assessment: Overall WFL for tasks assessed;Generalized weakness       Communication Communication Communication: No difficulties   Cognition Arousal/Alertness: Awake/alert Behavior During Therapy: WFL for tasks assessed/performed Overall Cognitive Status: Within Functional Limits for tasks assessed                                                  Home Living  Family/patient expects to be discharged to:: Private residence Living Arrangements: Alone Available Help at Discharge: Family;Available PRN/intermittently Type of Home: Mobile home Home Access: Ramped entrance     Home Layout: One level     Bathroom Shower/Tub: Tub/shower unit;Walk-in shower         Home Equipment: Rollator (4 wheels);Standard Walker;Cane - single point;Shower seat - built in          Prior Functioning/Environment Prior Level of Function : Independent/Modified Independent             Mobility Comments: multiple falls just this week at home (reported 4-5)          OT Problem List: Decreased strength;Decreased activity tolerance;Impaired balance (sitting and/or standing);Decreased safety awareness;Cardiopulmonary status limiting activity;Decreased knowledge of use of DME or AE      OT Treatment/Interventions: Self-care/ADL training;Therapeutic exercise;Balance training;Energy conservation;Therapeutic activities;DME and/or AE instruction;Cognitive remediation/compensation;Patient/family education    OT Goals(Current goals can be found in the care plan section) Acute Rehab OT Goals Patient Stated Goal: to go home OT Goal Formulation: With patient Time For Goal Achievement: 06/07/21 Potential to Achieve Goals: Fair ADL Goals Pt Will Perform Grooming: with modified independence;standing Pt Will Perform Lower Body Dressing: with modified independence;sit to/from stand Pt Will Transfer to Toilet: with modified independence;ambulating Pt Will Perform Toileting - Clothing Manipulation and hygiene: with modified independence;sit to/from stand  OT Frequency: Min 2X/week       AM-PAC OT "6 Clicks" Daily Activity     Outcome Measure Help from another person eating meals?: None Help from another person taking care of personal grooming?: None Help from another person toileting, which includes using toliet, bedpan, or urinal?: A Little Help from another person  bathing (including washing, rinsing, drying)?: A Little Help from another person to put on and taking off regular upper body clothing?: None Help from another person to put on and taking off regular lower body clothing?: A Little 6 Click Score: 21   End of Session Equipment Utilized During Treatment: Rolling walker (2 wheels) Nurse Communication: Mobility status (RR in 30's)  Activity Tolerance: Patient tolerated treatment well Patient left: in bed;with call bell/phone within reach  OT Visit Diagnosis: Unsteadiness on feet (R26.81);Muscle weakness (generalized) (M62.81);Repeated falls (R29.6);History of falling (Z91.81)                Time: 8676-7209 OT Time Calculation (min): 27 min Charges:  OT General Charges $OT Visit: 1 Visit OT Evaluation $OT Eval Low Complexity: 1 Low OT Treatments $Self Care/Home Management : 23-37 mins  Darleen Crocker, MS, OTR/L , CBIS ascom [phone]  05/24/21, 2:46 PM

## 2021-05-24 NOTE — H&P (Signed)
History and Physical    Tammy Shelton TML:[id number] DOB: 06/03/1950 DOA: 05/24/2021  PCP: Idelle Crouch, MD   Patient coming from: home  I have personally briefly reviewed patient's relevant medical records in [place]  Chief Complaint: falls  HPI: Tammy Shelton is a 71 y.o. female with medical history significant for Stage IV lung cancer on chemotherapy/immunomodulator therapy from [facility], spinal metastases with cord compression, s/p spinal fusion, HTN,  PE on Eliquis, who presents to the ED with frequent falls, generalized weakness, poor oral intake worsening over the past 2 days.  Additionally she has noted intermittent left-sided weakness to where she has difficulty holding anything in her hand, Not present at the time of evaluation.  She has had no vomiting or diarrhea.  Denies shortness of breath, chest pain or abdominal pain and denies dysuria.  ED course: T-max 99.1, tachycardic to 108 with otherwise normal vitals Blood work: Creatinine 2.70, up from 0.7 in September 2022 WBC 11, hemoglobin 10.4  EKG, personally viewed and interpreted: Sinus tachycardia at 106 with nonspecific ST-T wave changes  CT head with no acute intracranial abnormality  Patient given an IV fluid bolus.  Hospitalist consulted for admission.   Review of Systems: As per HPI otherwise all other systems on review of systems negative.   Assessment/Plan    Inadequate oral intake   Generalized weakness   Frequent falls - Suspect related to overall decline related to stage IV lung cancer - Nutritionist evaluation and PT evaluation - TOC consult.  Patient lives alone  Transient neurologic deficit weakness History of spinal metastases and spinal fusion -CT head with no acute findings - Follow-up MRI brain    AKI (acute kidney injury) ([place]) - Likely prerenal and secondary to decreased oral intake - IV hydration - Monitor renal function and avoid nephrotoxins    Adenocarcinoma of lung,  stage 4, right (HCC)   Cancer associated pain - Currently on dabrafenib and trametinib  History of PE December 2022   Chronic anticoagulation - Continue Eliquis     DVT prophylaxis: Eliquis Code Status: full code  Family Communication:  none  Disposition Plan: Back to previous home environment Consults called: none  Status: Observation    Physical Exam: Vitals:   05/23/21 2015 05/23/21 2020 05/24/21 0008 05/24/21 0155  BP:  101/75 122/87 (!) 110/99  Pulse:  (!) 108 91 79  Resp:  20 20 18   Temp:  99.1 F (37.3 C)    TempSrc:  Oral    SpO2:  94% 96% 97%  Weight: 48.5 kg     Height: 4\' 11"  (1.499 m)      Constitutional: Tammy Shelton female, oriented x 3 . Not in any apparent distress HEENT:      Head: Normocephalic and atraumatic.         Eyes: PERLA, EOMI, Conjunctivae are normal. Sclera is non-icteric.       Mouth/Throat: Mucous membranes are moist.       Neck: Supple with no signs of meningismus. Cardiovascular: Regular rate and rhythm. No murmurs, gallops, or rubs. 2+ symmetrical distal pulses are present . No JVD. No  LE edema Respiratory: Respiratory effort normal .Lungs sounds clear bilaterally. No wheezes, crackles, or rhonchi.  Gastrointestinal: Soft, non tender, non distended. Positive bowel sounds.  Genitourinary: No CVA tenderness. Musculoskeletal: Nontender with normal range of motion in all extremities. No cyanosis, or erythema of extremities. Neurologic:  Face is symmetric. Moving all extremities. No gross focal neurologic deficits . Skin:  Skin is warm, dry.  No rash or ulcers Psychiatric: Mood and affect are appropriate     Past Medical History:  Diagnosis Date   Hyperlipidemia    Hypertension     Past Surgical History:  Procedure Laterality Date   CHOLECYSTECTOMY       reports that she has quit smoking. She has never used smokeless tobacco. She reports that she does not currently use alcohol. She reports that she does not use drugs.  No  Known Allergies  Family History  Problem Relation Age of Onset   Breast cancer Neg Hx       Prior to Admission medications   Medication Sig Start Date End Date Taking? Authorizing Provider  azithromycin (ZITHROMAX Z-PAK) 250 MG tablet Take 2 tablets (500 mg) on  Day 1,  followed by 1 tablet (250 mg) once daily on Days 2 through 5. 02/06/21   Carrie Mew, MD  cefdinir (OMNICEF) 300 MG capsule Take 1 capsule (300 mg total) by mouth 2 (two) times daily. 02/06/21   Carrie Mew, MD  Cholecalciferol 25 MCG (1000 UT) capsule Take 1,000 Units by mouth daily.    [provider]  cyanocobalamin 1000 MCG tablet Take 1,000 mcg by mouth daily.    [provider]  dabrafenib mesylate (TAFLINAR) 50 MG capsule Take 100 mg by mouth every 12 (twelve) hours. 10/25/20   [provider]  feeding supplement (ENSURE ENLIVE / ENSURE PLUS) LIQD Take 237 mLs by mouth 3 (three) times daily between meals. 01/05/21   Enzo Bi, MD  HYDROmorphone (DILAUDID) 2 MG tablet Take 2 mg by mouth every 4 (four) hours as needed for pain. 12/03/20   [provider]  losartan (COZAAR) 50 MG tablet Take 1 tablet by mouth daily. 02/09/19 01/02/21  [provider]  lovastatin (MEVACOR) 20 MG tablet Take 1 tablet by mouth daily with supper. 02/09/19   [provider]  prochlorperazine (COMPAZINE) 10 MG tablet Take 10 mg by mouth every 6 (six) hours. 12/28/20   [provider]  sodium chloride 1 g tablet Take 1 g by mouth daily as needed. 12/03/20   [provider]  trametinib dimethyl sulfoxide (MEKINIST) 0.5 MG tablet Take 1.5 mg by mouth at bedtime. 10/25/20   [provider]      Labs on Admission: I have personally reviewed following labs and imaging studies  CBC: Recent Labs  Lab 05/23/21 2021  WBC 11.0*  HGB 10.4*  HCT 32.2*  MCV 84.1  PLT 536*   Basic Metabolic Panel: Recent Labs  Lab 05/23/21 2021 05/24/21 0012  NA 130*  --   K 4.4   --   CL 89*  --   CO2 30  --   GLUCOSE 119*  --   BUN 33*  --   CREATININE 2.70*  --   CALCIUM 11.1*  --   MG  --  2.2   GFR: Estimated Creatinine Clearance: 13.2 mL/min (A) (by C-G formula based on SCr of 2.7 mg/dL (H)). Liver Function Tests: No results for input(s): AST, ALT, ALKPHOS, BILITOT, PROT, ALBUMIN in the last 168 hours. No results for input(s): LIPASE, AMYLASE in the last 168 hours. No results for input(s): AMMONIA in the last 168 hours. Coagulation Profile: No results for input(s): INR, PROTIME in the last 168 hours. Cardiac Enzymes: No results for input(s): CKTOTAL, CKMB, CKMBINDEX, TROPONINI in the last 168 hours. BNP (last 3 results) No results for input(s): PROBNP in the last 8760 hours. HbA1C:  No results for input(s): HGBA1C in the last 72 hours. CBG: No results for input(s): GLUCAP in the last 168 hours. Lipid Profile: No results for input(s): CHOL, HDL, LDLCALC, TRIG, CHOLHDL, LDLDIRECT in the last 72 hours. Thyroid Function Tests: No results for input(s): TSH, T4TOTAL, FREET4, T3FREE, THYROIDAB in the last 72 hours. Anemia Panel: No results for input(s): VITAMINB12, FOLATE, FERRITIN, TIBC, IRON, RETICCTPCT in the last 72 hours. Urine analysis:    Component Value Date/Time   COLORURINE YELLOW (A) 02/06/2021 1614   APPEARANCEUR CLEAR (A) 02/06/2021 1614   LABSPEC 1.012 02/06/2021 1614   PHURINE 7.0 02/06/2021 [place] 02/06/2021 1614   HGBUR NEGATIVE 02/06/2021 [place] 02/06/2021 1614   KETONESUR NEGATIVE 02/06/2021 1614   PROTEINUR NEGATIVE 02/06/2021 1614   NITRITE NEGATIVE 02/06/2021 1614   LEUKOCYTESUR NEGATIVE 02/06/2021 1614    Radiological Exams on Admission: CT Head Wo Contrast  Result Date: 05/23/2021 CLINICAL DATA:  Head trauma, minor (Age >= 65y) EXAM: CT HEAD WITHOUT CONTRAST TECHNIQUE: Contiguous axial images were obtained from the base of the skull through the vertex without intravenous contrast.  RADIATION DOSE REDUCTION: This exam was performed according to the departmental dose-optimization program which includes automated exposure control, adjustment of the mA and/or kV according to patient size and/or use of iterative reconstruction technique. COMPARISON:  None. BRAIN: BRAIN Patchy and confluent areas of decreased attenuation are noted throughout the deep and periventricular white matter of the cerebral hemispheres bilaterally, compatible with chronic microvascular ischemic disease. Chronic left cerebellar infarction. No evidence of large-territorial acute infarction. No parenchymal hemorrhage. No mass lesion. No extra-axial collection. No mass effect or midline shift. No hydrocephalus. Basilar cisterns are patent. Vascular: No hyperdense vessel. Skull: No acute fracture or focal lesion. Sinuses/Orbits: Paranasal sinuses and mastoid air cells are clear. The orbits are unremarkable. Other: None. IMPRESSION: No acute intracranial abnormality in a patient with chronic microvascular ischemic changes and chronic left cerebellar infarction. Electronically Signed   By: Iven Finn M.D.   On: 05/23/2021 20:47       Athena Masse MD [facility]   05/24/2021, 2:48 AM

## 2021-05-24 NOTE — ED Notes (Signed)
Dr. Duncan at bedside 

## 2021-05-24 NOTE — ED Provider Notes (Signed)
[facility] Provider Note    Event Date/Time   First MD Initiated Contact with Patient 05/24/21 0125     (approximate)   History   Weakness and Fall   HPI  Tammy Shelton is a 71 y.o. female whose past medical history most notably includes recent diagnosis of pulmonary emboli on Eliquis as well and has a history of lung cancer being treated by [facility] oncology.  She presents for evaluation of multiple falls recently.  She has had a decrease in her overall status over the last week as told by the patient and confirmed by her daughter who is at bedside.  She has been doing "pretty well" but she has had multiple falls recently, is increasingly weak, and has not been eating or drinking very well, particularly over the course of the last 2 days.  Her daughter has been encouraging her to eat and drink to prevent becoming dehydrated but she is not interested in doing so.  She has not had a fever and denies chest pain, shortness of breath, and any acute abdominal pain, although she will sometimes develop abdominal pain that would get better after having a bowel movement.  She has not had any nausea or vomiting.  No dysuria.  Most concerning to her is that sometimes her left arm is very weak and she cannot even hold that up.  She also feels that her left leg has had some weakness which has been leading to her falls.  The symptoms have been present for at least 2 if not 3 days.  She said that they come and go, however, and both she and her daughter confirmed that her symptoms were better while they were out in triage waiting for room than they had been at home.     Physical Exam   Triage Vital Signs: ED Triage Vitals  Enc Vitals Group     BP 05/23/21 2020 101/75     Pulse Rate 05/23/21 2020 (!) 108     Resp 05/23/21 2020 20     Temp 05/23/21 2020 99.1 F (37.3 C)     Temp Source 05/23/21 2020 Oral     SpO2 05/23/21 2020 94 %     Weight 05/23/21 2015 48.5 kg (107 lb)      Height 05/23/21 2015 1.499 m (4\' 11" )     Head Circumference --      Peak Flow --      Pain Score 05/23/21 2014 8     Pain Loc --      Pain Edu? --      Excl. in [place]? --     Most recent vital signs: Vitals:   05/23/21 2020 05/24/21 0008  BP: 101/75 122/87  Pulse: (!) 108 91  Resp: 20 20  Temp: 99.1 F (37.3 C)   SpO2: 94% 96%     General: Awake, no distress.  Very sharp and alert, no evidence of confusion or dementia. CV:  Good peripheral perfusion.  Normal heart rate. Resp:  Normal effort.  No wheezing or accessory muscle usage. Abd:  No distention.  Mild tenderness to palpation of the periumbilical region but better with distraction.  No peritonitis. Neuro:  No tongue deviation, no cranial nerve deficits appreciated.  Good grip strength bilaterally, able to raise both arms and both legs and keep them up resisting against gravity.  No coordination difficulties.  No dysarthria nor aphasia.   ED Results / Procedures / Treatments   Labs (  all labs ordered are listed, but only abnormal results are displayed) Labs Reviewed  BASIC METABOLIC PANEL - Abnormal; Notable for the following components:      Result Value   Sodium 130 (*)    Chloride 89 (*)    Glucose, Bld 119 (*)    BUN 33 (*)    Creatinine, Ser 2.70 (*)    Calcium 11.1 (*)    GFR, Estimated 18 (*)    All other components within normal limits  CBC - Abnormal; Notable for the following components:   WBC 11.0 (*)    RBC 3.83 (*)    Hemoglobin 10.4 (*)    HCT 32.2 (*)    RDW 15.8 (*)    Platelets 519 (*)    All other components within normal limits  RESP PANEL BY RT-PCR (FLU A&B, COVID) ARPGX2  MAGNESIUM  URINALYSIS, ROUTINE W REFLEX MICROSCOPIC  TROPONIN I (HIGH SENSITIVITY)  TROPONIN I (HIGH SENSITIVITY)     EKG  ED ECG REPORT I, Hinda Kehr, the attending physician, personally viewed and interpreted this ECG.  Date: 05/23/2021 EKG Time: 20: 27 Rate: 106 Rhythm: Sinus tachycardia QRS Axis:  normal Intervals: normal ST/T Wave abnormalities: No significant ST or T wave changes. Narrative Interpretation: no definitive evidence of acute ischemia; does not meet STEMI criteria.    RADIOLOGY I personally reviewed the patient's CT scan and see no evidence of acute bleed nor obvious infarction.  Radiology confirmed that the CT scan shows some chronic microvascular changes but no evidence of acute CVA or bleed.  MRI brain pending at time of admission.    PROCEDURES:  Critical Care performed: No  .1-3 Lead EKG Interpretation Performed by: Hinda Kehr, MD Authorized by: Hinda Kehr, MD     Interpretation: normal     ECG rate:  92   ECG rate assessment: normal     Rhythm: sinus rhythm     Ectopy: none     Conduction: normal     MEDICATIONS ORDERED IN ED: Medications  0.9 %  sodium chloride infusion ( Intravenous New Bag/Given 05/24/21 0417)  acetaminophen (TYLENOL) tablet 650 mg (has no administration in time range)    Or  acetaminophen (TYLENOL) suppository 650 mg (has no administration in time range)  ondansetron (ZOFRAN) tablet 4 mg (has no administration in time range)    Or  ondansetron (ZOFRAN) injection 4 mg (has no administration in time range)  sodium chloride 0.9 % bolus 1,000 mL (0 mLs Intravenous Stopped 05/24/21 0415)     IMPRESSION / MDM / ASSESSMENT AND PLAN / ED COURSE  I reviewed the triage vital signs and the nursing notes.                              Differential diagnosis includes, but is not limited to, renal failure, other acute metabolic or electrolyte abnormality, sequela of her cancer or cancer treatment, CVA/TIA, acute intracranial hemorrhage, acute infection.  The patient is on the cardiac monitor to evaluate for evidence of arrhythmia and/or significant heart rate changes.  There is no evidence that the patient is suffering from an infectious process.  Initially she was mildly tachycardic but that resolved without intervention.  She is  mentally sharp and in no distress and admits to decreased oral intake recently.  At this point she has a NIH stroke scale of 0 but it sounds like her symptoms are intermittent.  TIA is possible, but it  is also possible that her current metabolic and electrolyte issues are causing the weakness and the falls.  Specifically, as part of her initial work-up, orders included basic metabolic panel, CBC, high-sensitivity troponin, and magnesium.  Her magnesium is normal and her CBC is notable for very mild leukocytosis and anemia but these are likely not contributory.  However her basic metabolic panel demonstrates a hyponatremia of 130 and more notably a creatinine of 2.7 which is new for her.  Her GFR is down to 18.  Her glucose is essentially normal at 119.  I talked with the patient and her daughter about the results and about the need for admission for her acute renal failure.  I personally reviewed her CT scan of the head and there is no evidence of acute abnormality, but I think it is reasonable to obtain an MR brain to further assess the possibility of CVA.  I personally reviewed her EKG and there is no evidence of ischemia.  COVID test is pending but she has no respiratory symptoms.  Patient and daughter understand the plan.  I ordered normal saline 1 L IV bolus and her MR brain is pending.  I will consult the hospitalist for admission.  Urinalysis can be obtained to rule out acute infection but she is having no urinary symptoms.   Clinical Course as of 05/24/21 1443  Fri May 24, 2021  0248 (delayed documentation) I consulted Dr. Damita Dunnings with the hospitalist service by phone, we discussed the case, and she will admit the patient. [CF]  0301 MR BRAIN WO CONTRAST The patient has been admitted to the hospital service, but I personally reviewed the patient's MRI brain and see no obvious evidence of acute ischemia.  The radiologist commented on an area of hypodensity that is similar to her prior CT and the  radiologist also does not feel that there is any evidence of acute ischemia. [CF]    Clinical Course User Index [CF] Hinda Kehr, MD     FINAL CLINICAL IMPRESSION(S) / ED DIAGNOSES   Final diagnoses:  Acute renal failure, unspecified acute renal failure type ([place])  Frequent falls  Stroke-like symptoms     Rx / DC Orders   ED Discharge Orders     None        Note:  This document was prepared using Dragon voice recognition software and may include unintentional dictation errors.   Hinda Kehr, MD 05/24/21 [phone]

## 2021-05-24 NOTE — Evaluation (Signed)
Physical Therapy Evaluation Patient Details Name: Tammy Shelton MRN: [id number] DOB: May 12, 1950 Today's Date: 05/24/2021  History of Present Illness  Tammy Shelton is a 71 y.o. female with medical history significant for Stage IV lung cancer on chemotherapy/immunomodulator therapy from [facility], spinal metastases with cord compression, s/p spinal fusion, HTN,  PE on Eliquis, who presents to the ED with frequent falls, generalized weakness, poor oral intake worsening over the past 2 days.  Additionally she has noted intermittent left-sided weakness to where she has difficulty holding anything in her hand, Not present at the time of evaluation.  She has had no vomiting or diarrhea.  Denies shortness of breath, chest pain or abdominal pain and denies dysuria   Clinical Impression  Pt A&Ox4 and resting in bed upon PT arrival to ED. She reports that has fallen about 4-5 times within the last week, due to some weakness in her legs she believes. Pt lives alone in a 1 story trailer that has a ramp to enter, with a walk-in shower + shower chair, and BSC that goes over toilet to elevate it. Currently uses a rollator as an AD for ambulation. She says she does shower on her own, but states that it might change now after this ED visit and recent falls. Pt states she does not drive, but her daughter (who lives ~2 min away) helps assist with ADLs, transporting her to appointments, and checking in daily.    During PT session Pt was able to perform bed mobility with CGA. Once at EOB in ED, sit to stand was performed w/ CGA and minimal use of RW to fully stand. Patient was able to ambulation ~50ft in ED, before reporting increase in fatigue and asking to return to her room. Pt will benefit from continued skilled PT in order to improve LE strength, mobility, gait, and restore PLOF. Current discharge recommendation to HHPT is appropriate due to the level of assistance required by the patient to ensure safety and improve overall  function.      Recommendations for follow up therapy are one component of a multi-disciplinary discharge planning process, led by the attending physician.  Recommendations may be updated based on patient status, additional functional criteria and insurance authorization.  Follow Up Recommendations Home health PT    Assistance Recommended at Discharge Frequent or constant Supervision/Assistance (Due to recent history of 4-5 falls in last week.)  Patient can return home with the following  A little help with walking and/or transfers;A little help with bathing/dressing/bathroom;Assistance with cooking/housework;Assist for transportation;Help with stairs or ramp for entrance    Equipment Recommendations None recommended by PT (Pt reports she has a RW, Rollator, and BSC)  Recommendations for Other Services       Functional Status Assessment Patient has had a recent decline in their functional status and demonstrates the ability to make significant improvements in function in a reasonable and predictable amount of time.     Precautions / Restrictions Precautions Precautions: Fall      Mobility  Bed Mobility Overal bed mobility: Needs Assistance Bed Mobility: Supine to Sit, Sit to Supine     Supine to sit: Supervision Sit to supine: Supervision   General bed mobility comments: from ED stretcher    Transfers Overall transfer level: Needs assistance Equipment used: Rolling walker (2 wheels) Transfers: Sit to/from Stand Sit to Stand: Supervision                Ambulation/Gait Ambulation/Gait assistance: Supervision, Min guard Gait Distance (Feet):  40 Feet Assistive device: Rolling walker (2 wheels) Gait Pattern/deviations: Decreased step length - right, Decreased step length - left, Decreased stride length, Step-through pattern Gait velocity: decreased        Stairs            Wheelchair Mobility    Modified Rankin (Stroke Patients Only)       Balance  Overall balance assessment: Needs assistance Sitting-balance support: Feet supported Sitting balance-Leahy Scale: Good     Standing balance support: During functional activity, Reliant on assistive device for balance Standing balance-Leahy Scale: Good                               Pertinent Vitals/Pain Pain Assessment Pain Assessment: No/denies pain    Home Living Family/patient expects to be discharged to:: Private residence Living Arrangements: Alone Available Help at Discharge: Family;Available PRN/intermittently Type of Home: Mobile home Home Access: Ramped entrance       Home Layout: One level Home Equipment: Rollator (4 wheels);Standard Walker;Cane - single point;Shower seat - built in      Prior Function Prior Level of Function : Independent/Modified Independent             Mobility Comments: multiple falls just this week at home (reported 4-5)       Hand Dominance   Dominant Hand: Right    Extremity/Trunk Assessment   Upper Extremity Assessment Upper Extremity Assessment: Overall WFL for tasks assessed;Generalized weakness    Lower Extremity Assessment Lower Extremity Assessment: Overall WFL for tasks assessed;Generalized weakness       Communication   Communication: No difficulties  Cognition Arousal/Alertness: Awake/alert Behavior During Therapy: WFL for tasks assessed/performed Overall Cognitive Status: Within Functional Limits for tasks assessed                                          General Comments      Exercises     Assessment/Plan    PT Assessment Patient needs continued PT services  PT Problem List Decreased strength;Decreased mobility;Decreased coordination;Decreased activity tolerance;Decreased balance;Decreased safety awareness       PT Treatment Interventions DME instruction;Gait training;Stair training;Functional mobility training;Therapeutic activities;Therapeutic exercise;Balance  training;Patient/family education    PT Goals (Current goals can be found in the Care Plan section)  Acute Rehab PT Goals Patient Stated Goal: to improve LE strength to go home and reduce number of falls PT Goal Formulation: With patient Time For Goal Achievement: 06/07/21 Potential to Achieve Goals: Good    Frequency Min 2X/week     Co-evaluation               AM-PAC PT "6 Clicks" Mobility  Outcome Measure Help needed turning from your back to your side while in a flat bed without using bedrails?: A Little Help needed moving from lying on your back to sitting on the side of a flat bed without using bedrails?: A Little Help needed moving to and from a bed to a chair (including a wheelchair)?: A Little Help needed standing up from a chair using your arms (e.g., wheelchair or bedside chair)?: A Little Help needed to walk in hospital room?: A Little Help needed climbing 3-5 steps with a railing? : A Lot 6 Click Score: 17    End of Session Equipment Utilized During Treatment: Gait belt Activity Tolerance: Patient tolerated  treatment well;Patient limited by fatigue Patient left: in bed;with call bell/phone within reach Nurse Communication: Mobility status PT Visit Diagnosis: Unsteadiness on feet (R26.81);Other abnormalities of gait and mobility (R26.89);Repeated falls (R29.6);Muscle weakness (generalized) (M62.81);History of falling (Z91.81);Difficulty in walking, not elsewhere classified (R26.2)    Time: 0272-5366 PT Time Calculation (min) (ACUTE ONLY): 24 min   Charges:             Jonnie Kind, SPT 05/24/2021, 3:29 PM

## 2021-05-24 NOTE — Progress Notes (Signed)
Initial Nutrition Assessment  DOCUMENTATION CODES:   Not applicable  INTERVENTION:   -Increase Ensure Enlive po to TID, each supplement provides 350 kcal and 20 grams of protein  -MVI with minerals daily  NUTRITION DIAGNOSIS:   Increased nutrient needs related to cancer and cancer related treatments as evidenced by estimated needs.  GOAL:   Patient will meet greater than or equal to 90% of their needs  MONITOR:   PO intake, Supplement acceptance, Labs, Weight trends, Skin, I & O's  REASON FOR ASSESSMENT:   Malnutrition Screening Tool    ASSESSMENT:   Tammy Shelton is a 71 y.o. female with medical history significant for Stage IV lung cancer on chemotherapy/immunomodulator therapy from [facility], spinal metastases with cord compression, s/p spinal fusion, HTN,  PE on Eliquis, who presents to the ED with frequent falls, generalized weakness, poor oral intake worsening over the past 2 days.  Additionally she has noted intermittent left-sided weakness to where she has difficulty holding anything in her hand, Not present at the time of evaluation.  She has had no vomiting or diarrhea.  Denies shortness of breath, chest pain or abdominal pain and denies dysuria.  Pt admitted with generalized weakness, fall, and transient neurological deficit weakness.  Pt unavailable at time of visit. RD unable to obtain further nutrition-related history or complete nutrition-focused physical exam at this time.    Pt with stage IV lung cancer and history of spinal metastasis and spinal fusion.   Pt currently on a dysphagia 3 diet. No meal completion data available to assess at this time.   Reviewed wt hx; pt has experienced a 15.7% wt loss over the past 3 months, which is significant for time frame.   Highly suspect pt with malnutrition, however, unable to identify at this time. Pt would greatly benefit from addition or oral nutrition supplements.   Medications reviewed and include 0.9% sodium  chloride infusion @ 100 ml/hr.   Labs reviewed: Na: 133.   Diet Order:   Diet Order             DIET DYS 3 Room service appropriate? Yes; Fluid consistency: Thin  Diet effective now                   EDUCATION NEEDS:   No education needs have been identified at this time  Skin:  Skin Assessment: Skin Integrity Issues: Skin Integrity Issues:: Other (Comment) Other: lt arm skin tear  Last BM:  05/24/21  Height:   Ht Readings from Last 1 Encounters:  05/23/21 4\' 11"  (1.499 m)    Weight:   Wt Readings from Last 1 Encounters:  05/23/21 48.5 kg    Ideal Body Weight:  44.7 kg  BMI:  Body mass index is 21.61 kg/m.  Estimated Nutritional Needs:   Kcal:  0093-8182  Protein:  90-105 grams  Fluid:  > 1.7 L    Loistine Chance, RD, LDN, [place] Registered Dietitian II Certified Diabetes Care and Education Specialist Please refer to [facility] for RD and/or RD on-call/weekend/after hours pager

## 2021-05-24 NOTE — TOC Initial Note (Signed)
Transition of Care [facility]) - Initial/Assessment Note    Patient Details  Name: Tammy Shelton MRN: [id number] Date of Birth: 02/12/1951  [facility] CM/SW Contact:    Pete Pelt, RN Phone Number: 05/24/2021, 4:28 PM  Clinical Narrative:  Patient lives alone but has strong support from daughter.  Daughter transports to appointments and helps with medications.  Patient is current with PCP and appointments at this time.  Home Health recommended, patient prefers [facility] advised and will check if patient qualifies with insurance.  Patient feels safe and comfortable returning home.  Toc contact information provided, TOC to follow.                 Expected Discharge Plan: [place] Barriers to Discharge: Continued Medical Work up   Patient Goals and CMS Choice Patient states their goals for this hospitalization and ongoing recovery are:: To get stronger and go home   Choice offered to / list presented to : NA  Expected Discharge Plan and Services Expected Discharge Plan: [place]   Discharge Planning Services: CM Consult   Living arrangements for the past 2 months: Single Family Home                                      Prior Living Arrangements/Services Living arrangements for the past 2 months: Single Family Home Lives with:: Self Patient language and need for interpreter reviewed:: Yes Do you feel safe going back to the place where you live?: Yes      Need for Family Participation in Patient Care: Yes (Comment) Care giver support system in place?: Yes (comment) Current home services: Home PT, Home OT Criminal Activity/Legal Involvement Pertinent to Current Situation/Hospitalization: No - Comment as needed  Activities of Daily Living      Permission Sought/Granted Permission sought to share information with : Case Manager Permission granted to share information with : Yes, Verbal  Permission Granted              Emotional Assessment Appearance:: Appears stated age Attitude/Demeanor/Rapport: Gracious, Engaged Affect (typically observed): Pleasant, Appropriate Orientation: : Oriented to Self, Oriented to Place, Oriented to  Time, Oriented to Situation Alcohol / Substance Use: Not Applicable Psych Involvement: No (comment)  Admission diagnosis:  Transient neurologic deficit [R29.818] Frequent falls [R29.6] Stroke-like symptoms [R29.90] Acute renal failure, unspecified acute renal failure type ([place]) [N17.9] Patient Active Problem List   Diagnosis Date Noted   Transient neurologic deficit 05/24/2021   AKI (acute kidney injury) ([place]) 05/24/2021   Chronic anticoagulation 05/24/2021   Inadequate oral intake 05/24/2021   Generalized weakness 05/24/2021   Frequent falls 05/24/2021   Malnutrition of moderate degree 01/04/2021   CAP (community acquired pneumonia) 01/02/2021   Adenocarcinoma of lung, stage 4, right ([place]) 10/19/2020   Cancer associated pain 10/19/2020   Anemia 07/29/2019   Lung mass 04/23/2018   History of vitamin D deficiency 02/13/2017   Hyponatremia 01/12/2017   Pure hypercholesterolemia 11/29/2015   ANA positive 06/19/2015   Arthralgia of multiple joints 06/19/2015   CKD (chronic kidney disease) stage 3, GFR 30-59 ml/min ([place]) 06/19/2015   Former smoker 05/29/2015   Essential hypertension 08/23/2014   PCP:  Idelle Crouch, MD Pharmacy:   [facility] [place], [place]  [address] [place] [address] Phone: [phone] Fax: [phone]     Social Determinants of Health (SDOH) Interventions    Readmission Risk Interventions No flowsheet data found.

## 2021-05-24 NOTE — ED Notes (Signed)
Pt to MRI

## 2021-05-24 NOTE — ED Notes (Signed)
Pt returned from MRI °

## 2021-05-25 DIAGNOSIS — R29818 Other symptoms and signs involving the nervous system: Secondary | ICD-10-CM | POA: Diagnosis not present

## 2021-05-25 LAB — BASIC METABOLIC PANEL
Anion gap: 9 (ref 5–15)
BUN: 29 mg/dL — ABNORMAL HIGH (ref 8–23)
CO2: 27 mmol/L (ref 22–32)
Calcium: 9.4 mg/dL (ref 8.9–10.3)
Chloride: 101 mmol/L (ref 98–111)
Creatinine, Ser: 2.04 mg/dL — ABNORMAL HIGH (ref 0.44–1.00)
GFR, Estimated: 26 mL/min — ABNORMAL LOW (ref >60)
Glucose, Bld: 86 mg/dL (ref 70–99)
Potassium: 3 mmol/L — ABNORMAL LOW (ref 3.5–5.1)
Sodium: 137 mmol/L (ref 135–145)

## 2021-05-25 LAB — MAGNESIUM: Magnesium: 2.1 mg/dL (ref 1.7–2.4)

## 2021-05-25 LAB — CBC
HCT: 26.4 % — ABNORMAL LOW (ref 36.0–46.0)
Hemoglobin: 8.6 g/dL — ABNORMAL LOW (ref 12.0–15.0)
MCH: 27.5 pg (ref 26.0–34.0)
MCHC: 32.6 g/dL (ref 30.0–36.0)
MCV: 84.3 fL (ref 80.0–100.0)
Platelets: 390 10*3/uL (ref 150–400)
RBC: 3.13 MIL/uL — ABNORMAL LOW (ref 3.87–5.11)
RDW: 16 % — ABNORMAL HIGH (ref 11.5–15.5)
WBC: 5.2 10*3/uL (ref 4.0–10.5)
nRBC: 0 % (ref 0.0–0.2)

## 2021-05-25 MED ORDER — LOSARTAN POTASSIUM 50 MG PO TABS: ORAL_TABLET | ORAL | Status: AC

## 2021-05-25 MED ORDER — POTASSIUM CHLORIDE CRYS ER 20 MEQ PO TBCR
40.0000 meq | EXTENDED_RELEASE_TABLET | ORAL | Status: DC
  Administered 2021-05-25: 40 meq via ORAL
  Filled 2021-05-25: qty 2

## 2021-05-25 MED ORDER — CYCLOBENZAPRINE HCL 10 MG PO TABS: 5.0000 mg | ORAL_TABLET | Freq: Three times a day (TID) | ORAL | 0 refills | Status: AC | PRN

## 2021-05-25 MED ORDER — PROCHLORPERAZINE MALEATE 10 MG PO TABS
10.0000 mg | ORAL_TABLET | Freq: Four times a day (QID) | ORAL | 0 refills | Status: AC | PRN
Start: 2021-05-25 — End: ?

## 2021-05-25 NOTE — Discharge Summary (Signed)
Physician Discharge Summary   Tammy Shelton  female DOB: 1950-07-06  FYB:[id number]  PCP: Idelle Crouch, MD  Admit date: 05/24/2021 Discharge date: 05/25/2021  Admitted From: home Disposition:  home Home Health: Yes CODE STATUS: Full code  Discharge Instructions     No wound care   Complete by: As directed         Hospital Course:  For full details, please see H&P, progress notes, consult notes and ancillary notes.  Briefly,  Tammy Shelton is a 71 y.o. female with medical history significant for Stage IV lung cancer on chemotherapy/immunomodulator therapy from [facility], spinal metastases with cord compression, s/p spinal fusion, HTN,  PE on Eliquis, who presented to the ED with frequent falls, generalized weakness, poor oral intake worsening over the past 2 days.  Additionally she has noted intermittent left-sided weakness to where she has difficulty holding anything in her hand, not present at the time of evaluation.     Inadequate oral intake   Generalized weakness   Frequent falls - Suspect related to overall decline related to stage IV lung cancer --MRI brain neg for acute finding. --PT eval, cleared pt to go home with [facility] PT/OT.   Transient neurologic deficit weakness History of spinal metastases and spinal fusion -CT head with no acute findings --MRI brain neg for acute finding.     AKI (acute kidney injury) ([place]) - Likely prerenal and secondary to decreased oral intake --Cr 2.7 on presentation, improved with IVF, and was 2.04 prior to discharge. --pt advised to keep regular oral hydration.     Adenocarcinoma of lung, stage 4, right ([place])   Cancer associated pain - Currently on dabrafenib and trametinib   History of PE December 2022   Chronic anticoagulation - Continue Eliquis   Discharge Diagnoses:  Principal Problem:   Transient neurologic deficit Active Problems:   AKI (acute kidney injury) ([place])   Adenocarcinoma of lung, stage 4, right  ([facility])   Cancer associated pain   Chronic anticoagulation   Inadequate oral intake   Generalized weakness   Frequent falls     Discharge Instructions:  Allergies as of 05/25/2021   No Known Allergies      Medication List     STOP taking these medications    amLODipine 10 MG tablet Commonly known as: NORVASC   azithromycin 250 MG tablet Commonly known as: Zithromax Z-Pak   cefdinir 300 MG capsule Commonly known as: OMNICEF   Cholecalciferol 25 MCG (1000 UT) capsule   cyanocobalamin 1000 MCG tablet   hydrochlorothiazide 12.5 MG tablet Commonly known as: HYDRODIURIL   sodium chloride 1 g tablet       TAKE these medications    aspirin EC 81 MG tablet Take 81 mg by mouth daily. Swallow whole.   cyclobenzaprine 10 MG tablet Commonly known as: FLEXERIL Take 0.5 tablets (5 mg total) by mouth 3 (three) times daily as needed for muscle spasms.   dabrafenib mesylate 50 MG capsule Commonly known as: TAFLINAR Take 100 mg by mouth every 12 (twelve) hours.   Eliquis 5 MG Tabs tablet Generic drug: apixaban Take 5 mg by mouth 2 (two) times daily.   feeding supplement Liqd Take 237 mLs by mouth 3 (three) times daily between meals.   HYDROmorphone 2 MG tablet Commonly known as: DILAUDID Take 2 mg by mouth every 4 (four) hours as needed for pain.   losartan 50 MG tablet Commonly known as: COZAAR Hold pending outpatient followup due to acute  kidney injury. What changed:  how much to take how to take this when to take this additional instructions   lovastatin 20 MG tablet Commonly known as: MEVACOR Take 1 tablet by mouth daily with supper.   OLANZapine 5 MG tablet Commonly known as: ZYPREXA Take 5 mg by mouth at bedtime.   prochlorperazine 10 MG tablet Commonly known as: COMPAZINE Take 1 tablet (10 mg total) by mouth every 6 (six) hours as needed for nausea or vomiting. Home med What changed:  when to take this reasons to take this additional  instructions   trametinib dimethyl sulfoxide 0.5 MG tablet Commonly known as: MEKINIST Take 1.5 mg by mouth at bedtime.         Follow-up Information     Idelle Crouch, MD Follow up in 1 week(s).   Specialty: Internal Medicine Contact information: [place] [address] [phone]                 No Known Allergies   The results of significant diagnostics from this hospitalization (including imaging, microbiology, ancillary and laboratory) are listed below for reference.   Consultations:   Procedures/Studies: CT Head Wo Contrast  Result Date: 05/23/2021 CLINICAL DATA:  Head trauma, minor (Age >= 65y) EXAM: CT HEAD WITHOUT CONTRAST TECHNIQUE: Contiguous axial images were obtained from the base of the skull through the vertex without intravenous contrast. RADIATION DOSE REDUCTION: This exam was performed according to the departmental dose-optimization program which includes automated exposure control, adjustment of the mA and/or kV according to patient size and/or use of iterative reconstruction technique. COMPARISON:  None. BRAIN: BRAIN Patchy and confluent areas of decreased attenuation are noted throughout the deep and periventricular white matter of the cerebral hemispheres bilaterally, compatible with chronic microvascular ischemic disease. Chronic left cerebellar infarction. No evidence of large-territorial acute infarction. No parenchymal hemorrhage. No mass lesion. No extra-axial collection. No mass effect or midline shift. No hydrocephalus. Basilar cisterns are patent. Vascular: No hyperdense vessel. Skull: No acute fracture or focal lesion. Sinuses/Orbits: Paranasal sinuses and mastoid air cells are clear. The orbits are unremarkable. Other: None. IMPRESSION: No acute intracranial abnormality in a patient with chronic microvascular ischemic changes and chronic left cerebellar infarction. Electronically Signed   By: Iven Finn M.D.   On: 05/23/2021 20:47   MR BRAIN WO CONTRAST  Result Date: 05/24/2021 CLINICAL DATA:  Initial evaluation for acute TIA. EXAM: MRI HEAD WITHOUT CONTRAST TECHNIQUE: Multiplanar, multiecho pulse sequences of the brain and surrounding structures were obtained without intravenous contrast. COMPARISON:  CT from 05/23/2021. FINDINGS: Brain: Diffuse prominence of the CSF containing spaces compatible generalized cerebral atrophy. Patchy and confluent T2/FLAIR hyperintensity involving the periventricular and deep white matter both cerebral hemispheres as well as the pons, most consistent with chronic microvascular ischemic disease, moderate to advanced in nature. Few scattered remote lacunar infarcts present about the deep gray nuclei and pons. Remote left cerebellar infarcts noted. No abnormal foci of restricted diffusion to suggest acute or subacute ischemia. Gray-white matter differentiation maintained. No other areas of chronic cortical infarction. No convincing evidence for acute intracranial hemorrhage. Subcentimeter focus of susceptibility artifact noted at the ventral pons (series 13, image 18). This appears to correspond with a subcentimeter hyperdensity on prior CT (series 2, image 8 on that exam). Finding could potentially affect a tiny cavernoma. No significant surrounding edema at this location to suggest acute hemorrhage. Few additional chronic micro hemorrhages noted elsewhere within both cerebral hemispheres, likely small  vessel/hypertensive in nature. No mass lesion, midline shift or mass effect. No hydrocephalus or extra-axial fluid collection. Pituitary gland suprasellar region normal. Midline structures intact. Vascular: Major intracranial vascular flow voids are maintained. Skull and upper cervical spine: Craniocervical junction within normal limits. Bone marrow signal intensity normal. 12 mm well-circumscribed T2 hyperintense lesion at the right occipital calvarium noted, nonspecific,  but likely benign. No worrisome osseous lesions. No scalp soft tissue abnormality. Sinuses/Orbits: Globes and orbital soft tissues within normal limits. Paranasal sinuses are clear. Trace bilateral mastoid effusions, of doubtful significance. Visualized nasopharynx unremarkable. Other: None. IMPRESSION: 1. No acute intracranial abnormality. 2. Age-related cerebral atrophy with moderate to advanced chronic microvascular ischemic disease, with a few scattered remote left cerebellar infarcts. 3. Subcentimeter focus of susceptibility artifact involving the ventral pons, which appears to correspond with a subcentimeter hyperdensity on prior CT. Finding could potentially affect a tiny cavernoma. No significant surrounding edema at this location to suggest acute hemorrhage. Electronically Signed   By: Jeannine Boga M.D.   On: 05/24/2021 02:58      Labs: BNP (last 3 results) Recent Labs    01/02/21 1551  BNP 93.8   Basic Metabolic Panel: Recent Labs  Lab 05/23/21 2021 05/24/21 0012 05/24/21 0922 05/25/21 0606  NA 130*  --  133* 137  K 4.4  --  3.5 3.0*  CL 89*  --  98 101  CO2 30  --  25 27  GLUCOSE 119*  --  95 86  BUN 33*  --  32* 29*  CREATININE 2.70*  --  2.50* 2.04*  CALCIUM 11.1*  --  9.7 9.4  MG  --  2.2  --  2.1   Liver Function Tests: No results for input(s): AST, ALT, ALKPHOS, BILITOT, PROT, ALBUMIN in the last 168 hours. No results for input(s): LIPASE, AMYLASE in the last 168 hours. No results for input(s): AMMONIA in the last 168 hours. CBC: Recent Labs  Lab 05/23/21 2021 05/25/21 0606  WBC 11.0* 5.2  HGB 10.4* 8.6*  HCT 32.2* 26.4*  MCV 84.1 84.3  PLT 519* 390   Cardiac Enzymes: No results for input(s): CKTOTAL, CKMB, CKMBINDEX, TROPONINI in the last 168 hours. BNP: Invalid input(s): POCBNP CBG: No results for input(s): GLUCAP in the last 168 hours. D-Dimer No results for input(s): DDIMER in the last 72 hours. Hgb A1c No results for input(s): HGBA1C in  the last 72 hours. Lipid Profile No results for input(s): CHOL, HDL, LDLCALC, TRIG, CHOLHDL, LDLDIRECT in the last 72 hours. Thyroid function studies No results for input(s): TSH, T4TOTAL, T3FREE, THYROIDAB in the last 72 hours.  Invalid input(s): FREET3 Anemia work up No results for input(s): VITAMINB12, FOLATE, FERRITIN, TIBC, IRON, RETICCTPCT in the last 72 hours. Urinalysis    Component Value Date/Time   COLORURINE STRAW (A) 05/24/2021 0930   APPEARANCEUR CLEAR (A) 05/24/2021 0930   LABSPEC 1.004 (L) 05/24/2021 0930   PHURINE 9.0 (H) 05/24/2021 0930   GLUCOSEU NEGATIVE 05/24/2021 0930   HGBUR SMALL (A) 05/24/2021 0930   BILIRUBINUR NEGATIVE 05/24/2021 0930   KETONESUR NEGATIVE 05/24/2021 0930   PROTEINUR NEGATIVE 05/24/2021 0930   NITRITE NEGATIVE 05/24/2021 0930   LEUKOCYTESUR NEGATIVE 05/24/2021 0930   Sepsis Labs Invalid input(s): PROCALCITONIN,  WBC,  LACTICIDVEN Microbiology Recent Results (from the past 240 hour(s))  Resp Panel by RT-PCR (Flu A&B, Covid) Nasopharyngeal Swab     Status: None   Collection Time: 05/24/21  1:48 AM   Specimen: Nasopharyngeal Swab; Nasopharyngeal(NP) swabs in vial  transport medium  Result Value Ref Range Status   SARS Coronavirus 2 by RT PCR NEGATIVE NEGATIVE Final    Comment: (NOTE) SARS-CoV-2 target nucleic acids are NOT DETECTED.  The SARS-CoV-2 RNA is generally detectable in upper respiratory specimens during the acute phase of infection. The lowest concentration of SARS-CoV-2 viral copies this assay can detect is 138 copies/mL. A negative result does not preclude SARS-Cov-2 infection and should not be used as the sole basis for treatment or other patient management decisions. A negative result may occur with  improper specimen collection/handling, submission of specimen other than nasopharyngeal swab, presence of viral mutation(s) within the areas targeted by this assay, and inadequate number of viral copies(<138 copies/mL). A  negative result must be combined with clinical observations, patient history, and epidemiological information. The expected result is Negative.  Fact Sheet for Patients:  EntrepreneurPulse.com.au  Fact Sheet for Healthcare Providers:  IncredibleEmployment.be  This test is no t yet approved or cleared by the [place] FDA and  has been authorized for detection and/or diagnosis of SARS-CoV-2 by FDA under an Emergency Use Authorization (EUA). This EUA will remain  in effect (meaning this test can be used) for the duration of the COVID-19 declaration under Section 564(b)(1) of the Act, 21 U.S.C.section 360bbb-3(b)(1), unless the authorization is terminated  or revoked sooner.       Influenza A by PCR NEGATIVE NEGATIVE Final   Influenza B by PCR NEGATIVE NEGATIVE Final    Comment: (NOTE) The Xpert Xpress SARS-CoV-2/FLU/RSV plus assay is intended as an aid in the diagnosis of influenza from Nasopharyngeal swab specimens and should not be used as a sole basis for treatment. Nasal washings and aspirates are unacceptable for Xpert Xpress SARS-CoV-2/FLU/RSV testing.  Fact Sheet for Patients: EntrepreneurPulse.com.au  Fact Sheet for Healthcare Providers: IncredibleEmployment.be  This test is not yet approved or cleared by the [place] FDA and has been authorized for detection and/or diagnosis of SARS-CoV-2 by FDA under an Emergency Use Authorization (EUA). This EUA will remain in effect (meaning this test can be used) for the duration of the COVID-19 declaration under Section 564(b)(1) of the Act, 21 U.S.C. section 360bbb-3(b)(1), unless the authorization is terminated or revoked.  Performed at [facility], [address] [place], [place] [address]      Total time spend on discharging this patient, including the last patient exam, discussing the hospital stay, instructions for ongoing care  as it relates to all pertinent caregivers, as well as preparing the medical discharge records, prescriptions, and/or referrals as applicable, is 35 minutes.    Enzo Bi, MD  [facility] 05/25/2021, 9:19 AM

## 2021-05-25 NOTE — TOC Transition Note (Signed)
Transition of Care [facility] - CM/SW Discharge Note   Patient Details  Name: Tammy Shelton MRN: [id number] Date of Birth: [birth year]  [facility] CM/SW Contact:  Harriet Masson, RN Phone [phone] 05/25/2021, 9:56 AM   Clinical Narrative:    Recommended HHPT/OT upon discharge. RN spoke with pt on options for [facility]. Pt has requested [facility] however denied. Pt receptive to [facility]. Spoke with [place] who accepted pt for [employer].   Team updated with no other needs. Daughter will be transporting pt home upin her discharge today.  Final next level of care: Home w Home Health Services Barriers to Discharge: Barriers Resolved   Patient Goals and CMS Choice Patient states their goals for this hospitalization and ongoing recovery are:: To get stronger and go home   Choice offered to / list presented to : Patient  Discharge Placement                  Name of family member notified:  (Spoke directly to pt who did not want RN to speak with her daughter concerning arrangements) Patient and family notified of of transfer: 05/25/21  Discharge Plan and Services   Discharge Planning Services: CM Consult                      HH Arranged: PT, OT [place] Agency: [place] Date [place]: 05/25/21 Time [place]: [phone] Representative spoke with at [place]: [place]  Social Determinants of Health ([place]) Interventions     Readmission Risk Interventions No flowsheet data found.

## 2021-05-27 DIAGNOSIS — R638 Other symptoms and signs concerning food and fluid intake: Secondary | ICD-10-CM | POA: Diagnosis not present

## 2021-05-27 DIAGNOSIS — C782 Secondary malignant neoplasm of pleura: Secondary | ICD-10-CM | POA: Diagnosis not present

## 2021-05-27 DIAGNOSIS — R42 Dizziness and giddiness: Secondary | ICD-10-CM | POA: Diagnosis not present

## 2021-05-27 DIAGNOSIS — C7951 Secondary malignant neoplasm of bone: Secondary | ICD-10-CM | POA: Diagnosis not present

## 2021-05-27 DIAGNOSIS — C3491 Malignant neoplasm of unspecified part of right bronchus or lung: Secondary | ICD-10-CM | POA: Diagnosis not present

## 2021-05-27 DIAGNOSIS — G893 Neoplasm related pain (acute) (chronic): Secondary | ICD-10-CM | POA: Diagnosis not present

## 2021-05-27 DIAGNOSIS — N179 Acute kidney failure, unspecified: Secondary | ICD-10-CM | POA: Diagnosis not present

## 2021-05-31 DIAGNOSIS — C7951 Secondary malignant neoplasm of bone: Secondary | ICD-10-CM | POA: Diagnosis not present

## 2021-05-31 DIAGNOSIS — C782 Secondary malignant neoplasm of pleura: Secondary | ICD-10-CM | POA: Diagnosis not present

## 2021-05-31 DIAGNOSIS — R42 Dizziness and giddiness: Secondary | ICD-10-CM | POA: Diagnosis not present

## 2021-05-31 DIAGNOSIS — N179 Acute kidney failure, unspecified: Secondary | ICD-10-CM | POA: Diagnosis not present

## 2021-05-31 DIAGNOSIS — C3491 Malignant neoplasm of unspecified part of right bronchus or lung: Secondary | ICD-10-CM | POA: Diagnosis not present

## 2021-06-14 DIAGNOSIS — Z7901 Long term (current) use of anticoagulants: Secondary | ICD-10-CM | POA: Diagnosis not present

## 2021-06-14 DIAGNOSIS — G893 Neoplasm related pain (acute) (chronic): Secondary | ICD-10-CM | POA: Diagnosis not present

## 2021-06-14 DIAGNOSIS — Z87891 Personal history of nicotine dependence: Secondary | ICD-10-CM | POA: Diagnosis not present

## 2021-06-14 DIAGNOSIS — C3491 Malignant neoplasm of unspecified part of right bronchus or lung: Secondary | ICD-10-CM | POA: Diagnosis not present

## 2021-06-14 DIAGNOSIS — I10 Essential (primary) hypertension: Secondary | ICD-10-CM | POA: Diagnosis not present

## 2021-06-14 DIAGNOSIS — C7951 Secondary malignant neoplasm of bone: Secondary | ICD-10-CM | POA: Diagnosis not present

## 2021-06-14 DIAGNOSIS — E785 Hyperlipidemia, unspecified: Secondary | ICD-10-CM | POA: Diagnosis not present

## 2021-06-17 DIAGNOSIS — G9520 Unspecified cord compression: Secondary | ICD-10-CM | POA: Diagnosis not present

## 2021-06-17 DIAGNOSIS — C7951 Secondary malignant neoplasm of bone: Secondary | ICD-10-CM | POA: Diagnosis not present

## 2021-06-17 DIAGNOSIS — G893 Neoplasm related pain (acute) (chronic): Secondary | ICD-10-CM | POA: Diagnosis not present

## 2021-06-17 DIAGNOSIS — C782 Secondary malignant neoplasm of pleura: Secondary | ICD-10-CM | POA: Diagnosis not present

## 2021-06-17 DIAGNOSIS — C3491 Malignant neoplasm of unspecified part of right bronchus or lung: Secondary | ICD-10-CM | POA: Diagnosis not present

## 2021-06-21 DIAGNOSIS — N189 Chronic kidney disease, unspecified: Secondary | ICD-10-CM | POA: Diagnosis not present

## 2021-06-21 DIAGNOSIS — Z0000 Encounter for general adult medical examination without abnormal findings: Secondary | ICD-10-CM | POA: Diagnosis not present

## 2021-06-21 DIAGNOSIS — C3490 Malignant neoplasm of unspecified part of unspecified bronchus or lung: Secondary | ICD-10-CM | POA: Diagnosis not present

## 2021-06-21 DIAGNOSIS — E785 Hyperlipidemia, unspecified: Secondary | ICD-10-CM | POA: Diagnosis not present

## 2021-06-21 DIAGNOSIS — I129 Hypertensive chronic kidney disease with stage 1 through stage 4 chronic kidney disease, or unspecified chronic kidney disease: Secondary | ICD-10-CM | POA: Diagnosis not present

## 2021-07-22 DIAGNOSIS — C782 Secondary malignant neoplasm of pleura: Secondary | ICD-10-CM | POA: Diagnosis not present

## 2021-07-22 DIAGNOSIS — G9520 Unspecified cord compression: Secondary | ICD-10-CM | POA: Diagnosis not present

## 2021-07-22 DIAGNOSIS — G893 Neoplasm related pain (acute) (chronic): Secondary | ICD-10-CM | POA: Diagnosis not present

## 2021-07-22 DIAGNOSIS — C7951 Secondary malignant neoplasm of bone: Secondary | ICD-10-CM | POA: Diagnosis not present

## 2021-07-22 DIAGNOSIS — C3491 Malignant neoplasm of unspecified part of right bronchus or lung: Secondary | ICD-10-CM | POA: Diagnosis not present

## 2021-08-09 DIAGNOSIS — G893 Neoplasm related pain (acute) (chronic): Secondary | ICD-10-CM | POA: Diagnosis not present

## 2021-08-09 DIAGNOSIS — M48061 Spinal stenosis, lumbar region without neurogenic claudication: Secondary | ICD-10-CM | POA: Diagnosis not present

## 2021-08-09 DIAGNOSIS — M4802 Spinal stenosis, cervical region: Secondary | ICD-10-CM | POA: Diagnosis not present

## 2021-08-09 DIAGNOSIS — D492 Neoplasm of unspecified behavior of bone, soft tissue, and skin: Secondary | ICD-10-CM | POA: Diagnosis not present

## 2021-08-09 DIAGNOSIS — C3490 Malignant neoplasm of unspecified part of unspecified bronchus or lung: Secondary | ICD-10-CM | POA: Diagnosis not present

## 2021-08-09 DIAGNOSIS — C7951 Secondary malignant neoplasm of bone: Secondary | ICD-10-CM | POA: Diagnosis not present

## 2021-08-09 DIAGNOSIS — M4804 Spinal stenosis, thoracic region: Secondary | ICD-10-CM | POA: Diagnosis not present

## 2021-08-12 DIAGNOSIS — C7951 Secondary malignant neoplasm of bone: Secondary | ICD-10-CM | POA: Diagnosis not present

## 2021-08-12 DIAGNOSIS — C3491 Malignant neoplasm of unspecified part of right bronchus or lung: Secondary | ICD-10-CM | POA: Diagnosis not present

## 2021-08-12 DIAGNOSIS — I10 Essential (primary) hypertension: Secondary | ICD-10-CM | POA: Diagnosis not present

## 2021-08-12 DIAGNOSIS — G893 Neoplasm related pain (acute) (chronic): Secondary | ICD-10-CM | POA: Diagnosis not present

## 2021-08-12 DIAGNOSIS — D630 Anemia in neoplastic disease: Secondary | ICD-10-CM | POA: Diagnosis not present

## 2021-08-12 DIAGNOSIS — E440 Moderate protein-calorie malnutrition: Secondary | ICD-10-CM | POA: Diagnosis not present

## 2021-08-12 DIAGNOSIS — E7800 Pure hypercholesterolemia, unspecified: Secondary | ICD-10-CM | POA: Diagnosis not present

## 2021-08-28 DIAGNOSIS — C782 Secondary malignant neoplasm of pleura: Secondary | ICD-10-CM | POA: Diagnosis not present

## 2021-08-28 DIAGNOSIS — C3491 Malignant neoplasm of unspecified part of right bronchus or lung: Secondary | ICD-10-CM | POA: Diagnosis not present

## 2021-08-28 DIAGNOSIS — G893 Neoplasm related pain (acute) (chronic): Secondary | ICD-10-CM | POA: Diagnosis not present

## 2021-08-28 DIAGNOSIS — N179 Acute kidney failure, unspecified: Secondary | ICD-10-CM | POA: Diagnosis not present

## 2021-08-28 DIAGNOSIS — C7951 Secondary malignant neoplasm of bone: Secondary | ICD-10-CM | POA: Diagnosis not present

## 2021-08-28 DIAGNOSIS — J90 Pleural effusion, not elsewhere classified: Secondary | ICD-10-CM | POA: Diagnosis not present

## 2021-09-04 DIAGNOSIS — G893 Neoplasm related pain (acute) (chronic): Secondary | ICD-10-CM | POA: Diagnosis not present

## 2021-09-04 DIAGNOSIS — C7951 Secondary malignant neoplasm of bone: Secondary | ICD-10-CM | POA: Diagnosis not present

## 2021-09-05 DIAGNOSIS — C782 Secondary malignant neoplasm of pleura: Secondary | ICD-10-CM | POA: Diagnosis not present

## 2021-09-05 DIAGNOSIS — C7951 Secondary malignant neoplasm of bone: Secondary | ICD-10-CM | POA: Diagnosis not present

## 2021-09-06 DIAGNOSIS — C7951 Secondary malignant neoplasm of bone: Secondary | ICD-10-CM | POA: Diagnosis not present

## 2021-09-06 DIAGNOSIS — Z510 Encounter for antineoplastic radiation therapy: Secondary | ICD-10-CM | POA: Diagnosis not present

## 2021-09-25 DIAGNOSIS — C782 Secondary malignant neoplasm of pleura: Secondary | ICD-10-CM | POA: Diagnosis not present

## 2021-09-25 DIAGNOSIS — M4317 Spondylolisthesis, lumbosacral region: Secondary | ICD-10-CM | POA: Diagnosis not present

## 2021-09-25 DIAGNOSIS — C7951 Secondary malignant neoplasm of bone: Secondary | ICD-10-CM | POA: Diagnosis not present

## 2021-09-25 DIAGNOSIS — Z981 Arthrodesis status: Secondary | ICD-10-CM | POA: Diagnosis not present

## 2021-09-27 DIAGNOSIS — C3491 Malignant neoplasm of unspecified part of right bronchus or lung: Secondary | ICD-10-CM | POA: Diagnosis not present

## 2021-09-27 DIAGNOSIS — C7951 Secondary malignant neoplasm of bone: Secondary | ICD-10-CM | POA: Diagnosis not present

## 2021-09-27 DIAGNOSIS — Z515 Encounter for palliative care: Secondary | ICD-10-CM | POA: Diagnosis not present

## 2021-09-27 DIAGNOSIS — G893 Neoplasm related pain (acute) (chronic): Secondary | ICD-10-CM | POA: Diagnosis not present

## 2021-10-07 DIAGNOSIS — J189 Pneumonia, unspecified organism: Secondary | ICD-10-CM | POA: Diagnosis not present

## 2021-10-07 DIAGNOSIS — C782 Secondary malignant neoplasm of pleura: Secondary | ICD-10-CM | POA: Diagnosis not present

## 2021-10-07 DIAGNOSIS — C7951 Secondary malignant neoplasm of bone: Secondary | ICD-10-CM | POA: Diagnosis not present

## 2021-10-07 DIAGNOSIS — G893 Neoplasm related pain (acute) (chronic): Secondary | ICD-10-CM | POA: Diagnosis not present

## 2021-10-07 DIAGNOSIS — Z7189 Other specified counseling: Secondary | ICD-10-CM | POA: Diagnosis not present

## 2021-10-07 DIAGNOSIS — C3491 Malignant neoplasm of unspecified part of right bronchus or lung: Secondary | ICD-10-CM | POA: Diagnosis not present

## 2021-10-21 DIAGNOSIS — C7951 Secondary malignant neoplasm of bone: Secondary | ICD-10-CM | POA: Diagnosis not present

## 2021-10-21 DIAGNOSIS — C782 Secondary malignant neoplasm of pleura: Secondary | ICD-10-CM | POA: Diagnosis not present

## 2021-10-21 DIAGNOSIS — C3491 Malignant neoplasm of unspecified part of right bronchus or lung: Secondary | ICD-10-CM | POA: Diagnosis not present

## 2021-10-21 DIAGNOSIS — N183 Chronic kidney disease, stage 3 unspecified: Secondary | ICD-10-CM | POA: Diagnosis not present

## 2021-10-21 DIAGNOSIS — C7802 Secondary malignant neoplasm of left lung: Secondary | ICD-10-CM | POA: Diagnosis not present

## 2021-10-21 DIAGNOSIS — J9691 Respiratory failure, unspecified with hypoxia: Secondary | ICD-10-CM | POA: Diagnosis not present

## 2021-10-21 DIAGNOSIS — I129 Hypertensive chronic kidney disease with stage 1 through stage 4 chronic kidney disease, or unspecified chronic kidney disease: Secondary | ICD-10-CM | POA: Diagnosis not present

## 2021-12-03 DEATH — deceased

## 2022-08-06 IMAGING — CT CT HEAD W/O CM
4 series · 16 of 47 positions shown, 18 images · non-contrast
Comparison: None.

CLINICAL DATA: Head trauma, minor (Age >= 65y)



[Series 2: head wo · axial · 0.43mm/px · z∈[+213,+328]mm · 7 of 31 slices shown, 9 images]
[im 4/31  brain]
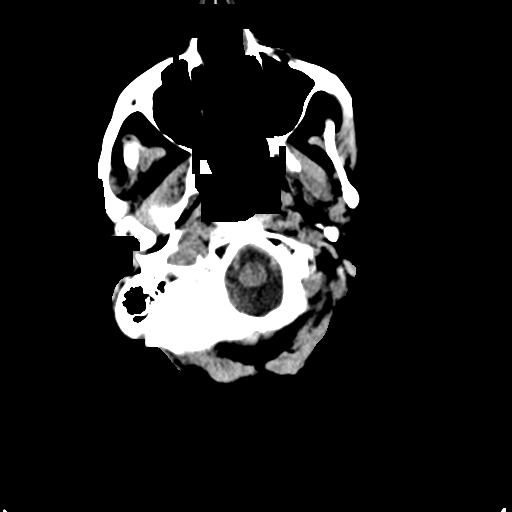
[im 4/31  bone]
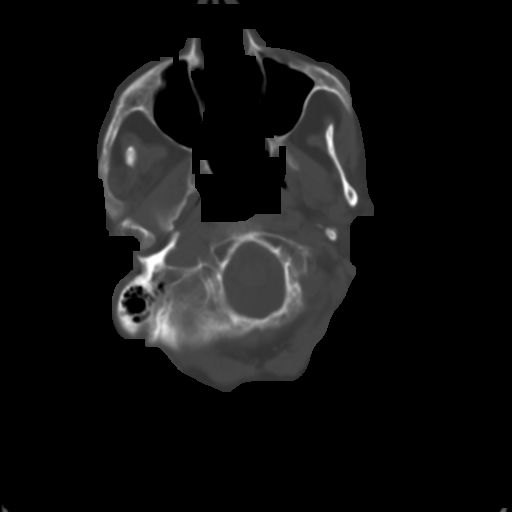
[im 8/31  brain]
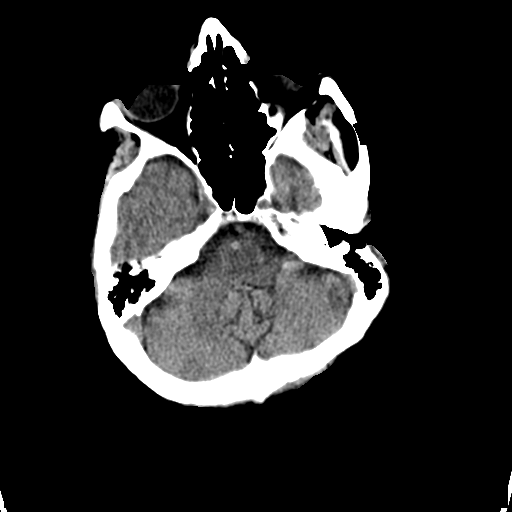
[im 12/31  brain]
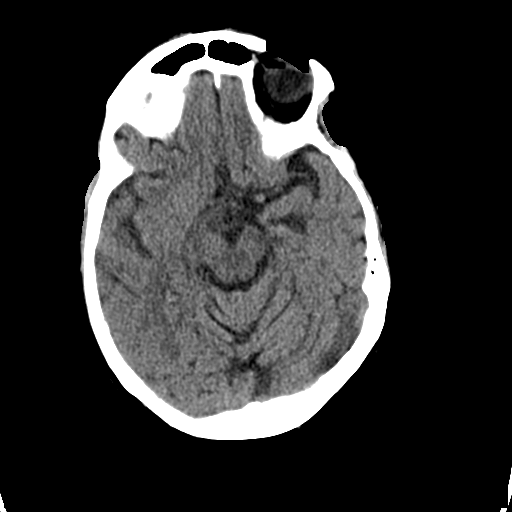
[im 16/31  brain]
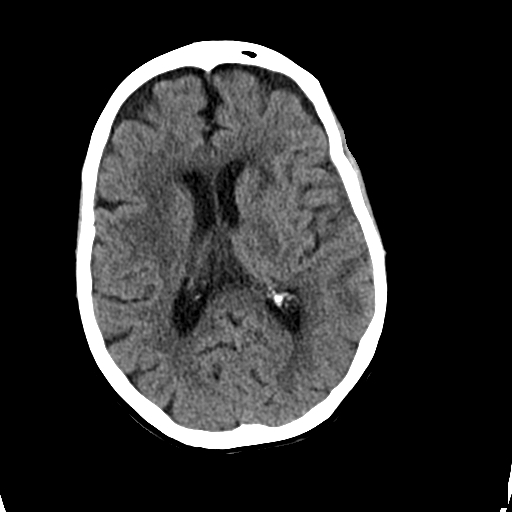
[im 19/31  brain]
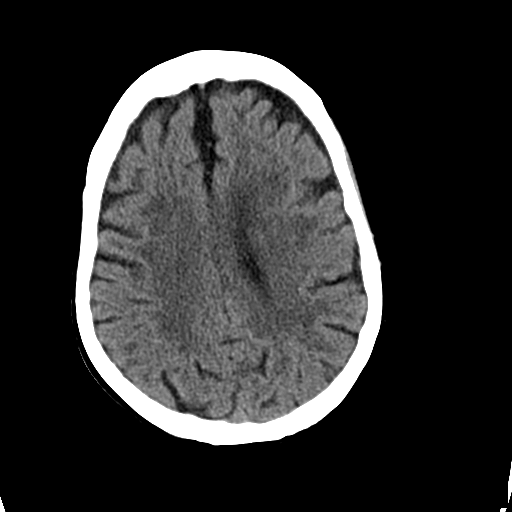
[im 19/31  bone]
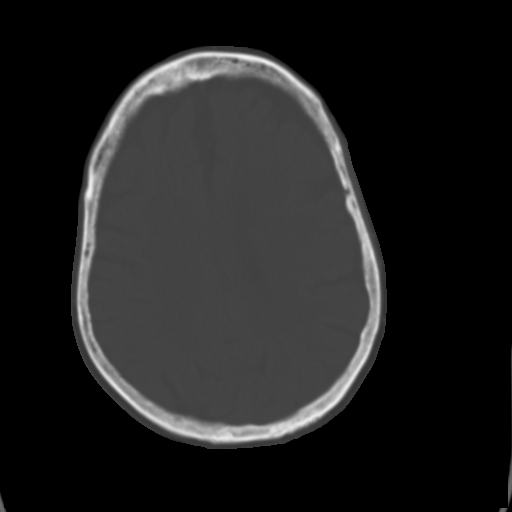
[im 23/31  brain]
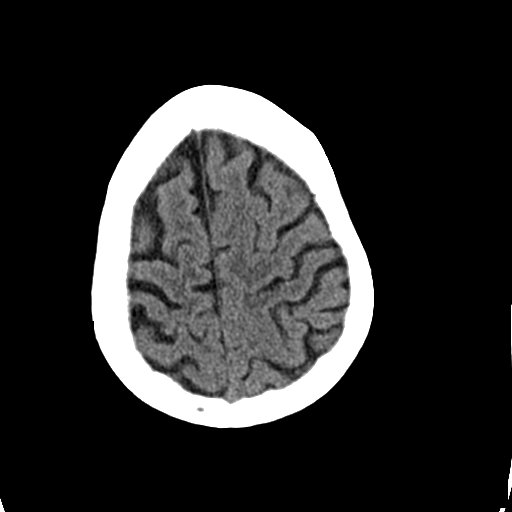
[im 27/31  brain]
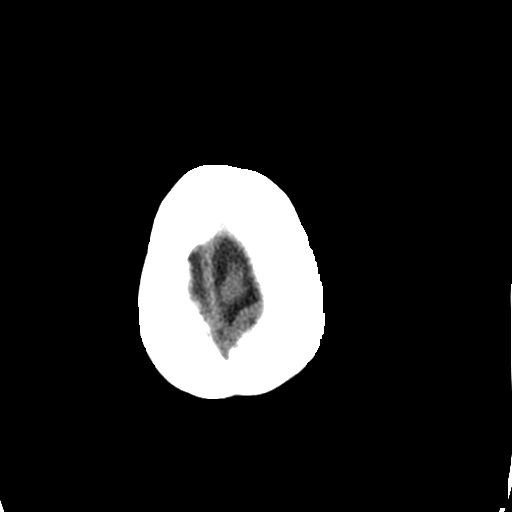

[Series 3: head bone · axial · 0.43mm/px · z∈[+212,+242]mm · 3 of 76 slices shown]
[im 8/76  bone]
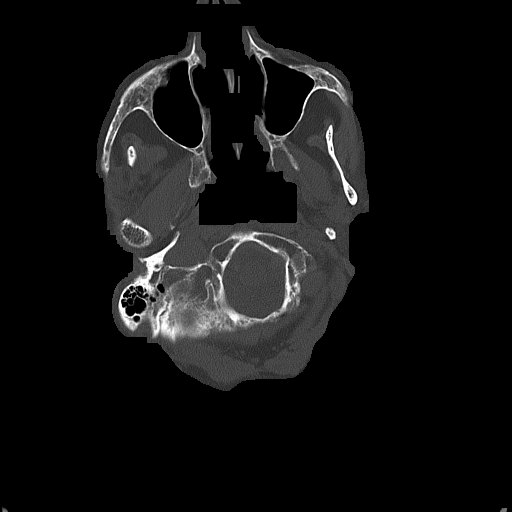
[im 16/76  bone]
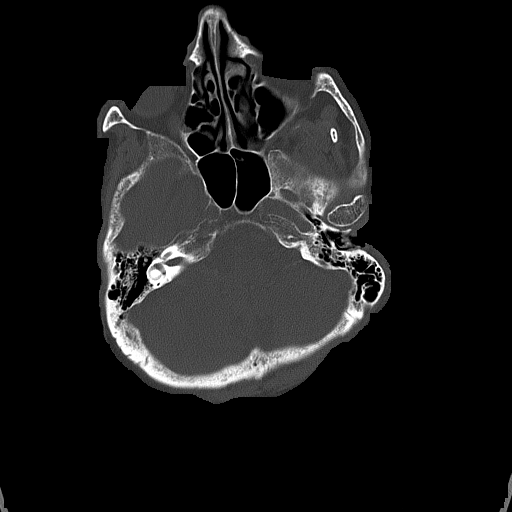
[im 23/76  bone]
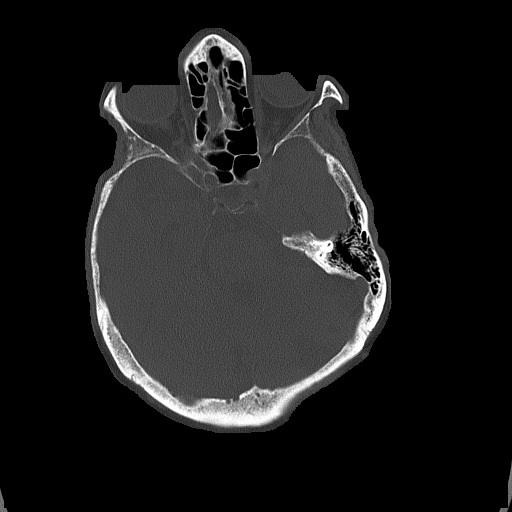

[Series 4: coronal soft tissue · coronal · 0.33mm/px · 3 of 69 slices shown]
[im 23/69  brain]
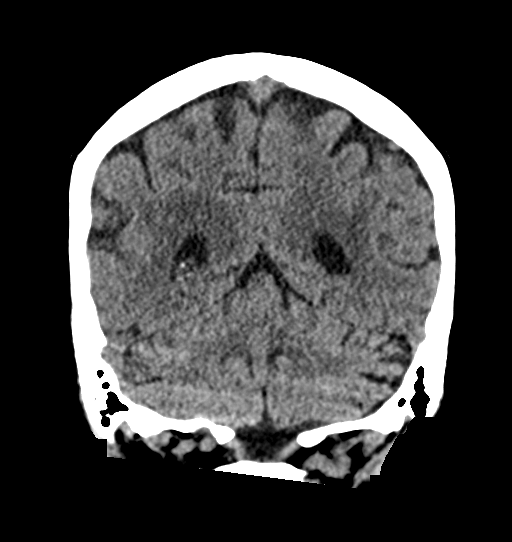
[im 31/69  brain]
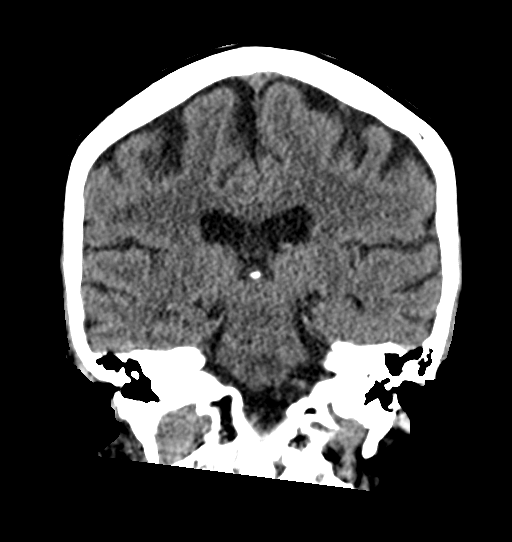
[im 38/69  brain]
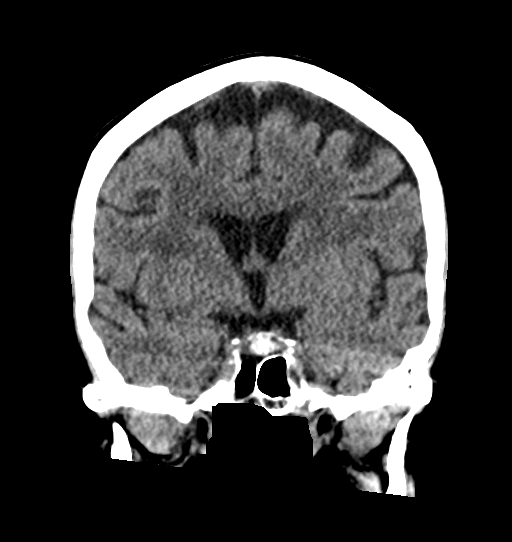

[Series 5: sagittal soft tissue · sagittal · 0.33mm/px · 3 of 52 slices shown]
[im 18/52  brain]
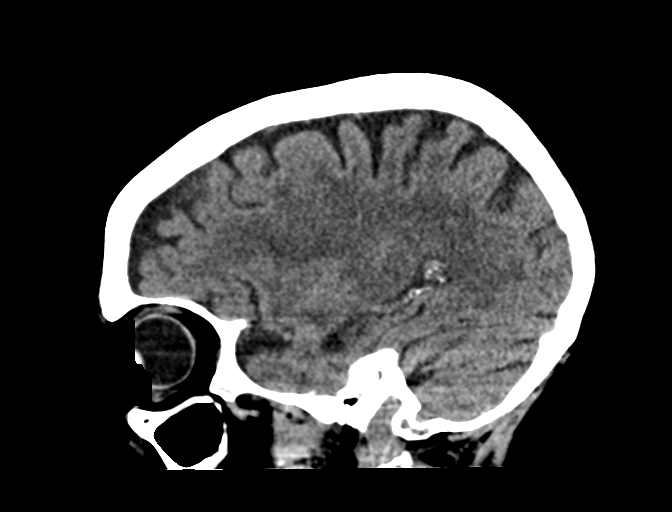
[im 26/52  brain]
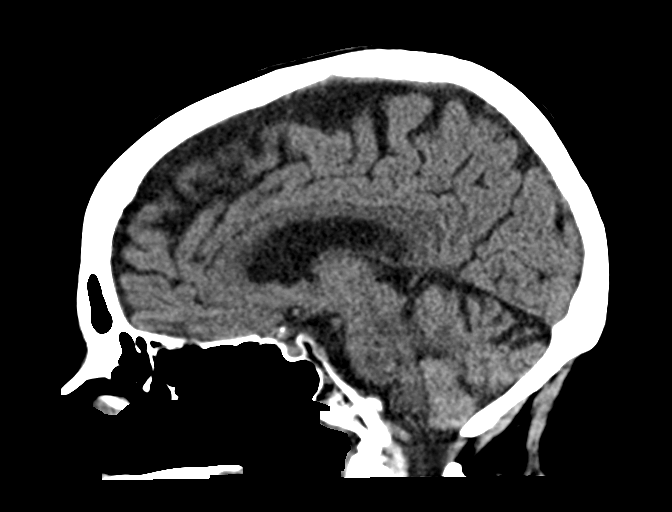
[im 34/52  brain]
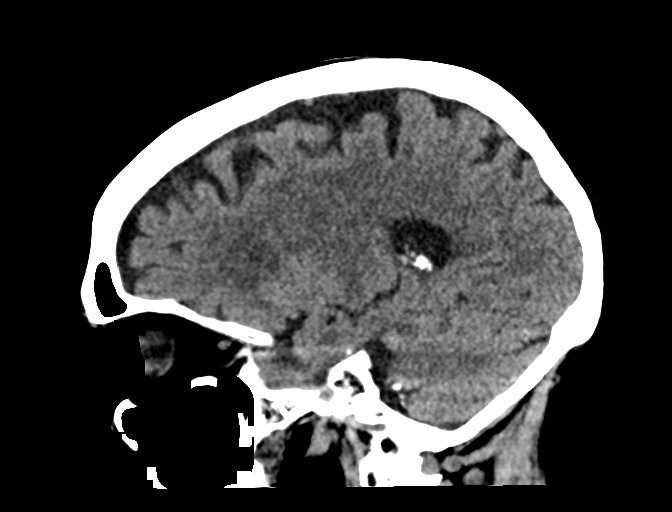

[16 of 47 positions shown; findings below may reference images not displayed]

BRAIN:
BRAIN
Patchy and confluent areas of decreased attenuation are noted
throughout the deep and periventricular white matter of the cerebral
hemispheres bilaterally, compatible with chronic microvascular
ischemic disease. Chronic left cerebellar infarction.

No evidence of large-territorial acute infarction. No parenchymal
hemorrhage. No mass lesion. No extra-axial collection.

No mass effect or midline shift. No hydrocephalus. Basilar cisterns
are patent.

Vascular: No hyperdense vessel.

Skull: No acute fracture or focal lesion.

Sinuses/Orbits: Paranasal sinuses and mastoid air cells are clear.
The orbits are unremarkable.

Other: None.
IMPRESSION: No acute intracranial abnormality in a patient with chronic
microvascular ischemic changes and chronic left cerebellar
infarction.
